# Patient Record
Sex: Female | Born: 1950 | Race: White | Hispanic: No | Marital: Married | Smoking: Never smoker
Health system: Southern US, Community
[De-identification: ages and names within clinical notes are randomized; demographics above are authoritative.]

## PROBLEM LIST (undated history)

## (undated) DIAGNOSIS — J45909 Unspecified asthma, uncomplicated: Secondary | ICD-10-CM

## (undated) DIAGNOSIS — I10 Essential (primary) hypertension: Secondary | ICD-10-CM

## (undated) DIAGNOSIS — D039 Melanoma in situ, unspecified: Secondary | ICD-10-CM

## (undated) DIAGNOSIS — E785 Hyperlipidemia, unspecified: Secondary | ICD-10-CM

## (undated) DIAGNOSIS — M1990 Unspecified osteoarthritis, unspecified site: Secondary | ICD-10-CM

## (undated) DIAGNOSIS — Z87440 Personal history of urinary (tract) infections: Secondary | ICD-10-CM

## (undated) DIAGNOSIS — Z8619 Personal history of other infectious and parasitic diseases: Secondary | ICD-10-CM

## (undated) DIAGNOSIS — U071 COVID-19: Secondary | ICD-10-CM

## (undated) HISTORY — DX: Unspecified osteoarthritis, unspecified site: M19.90

## (undated) HISTORY — PX: BREAST EXCISIONAL BIOPSY: SUR124

## (undated) HISTORY — DX: Personal history of urinary (tract) infections: Z87.440

## (undated) HISTORY — PX: NEPHRECTOMY: SHX65

## (undated) HISTORY — DX: Melanoma in situ, unspecified: D03.9

## (undated) HISTORY — DX: Personal history of other infectious and parasitic diseases: Z86.19

## (undated) HISTORY — DX: Unspecified asthma, uncomplicated: J45.909

## (undated) HISTORY — PX: TONSILLECTOMY AND ADENOIDECTOMY: SUR1326

## (undated) HISTORY — DX: COVID-19: U07.1

## (undated) HISTORY — DX: Hyperlipidemia, unspecified: E78.5

## (undated) HISTORY — DX: Essential (primary) hypertension: I10

---

## 1988-04-11 HISTORY — PX: ABDOMINAL HYSTERECTOMY: SHX81

## 1989-04-11 HISTORY — PX: BREAST SURGERY: SHX581

## 2012-12-03 LAB — HM COLONOSCOPY: HM COLON: NORMAL

## 2012-12-03 LAB — HM PAP SMEAR: HM PAP: NORMAL

## 2014-05-05 LAB — HM MAMMOGRAPHY: HM MAMMO: NORMAL

## 2014-12-04 ENCOUNTER — Encounter (INDEPENDENT_AMBULATORY_CARE_PROVIDER_SITE_OTHER): Payer: Self-pay

## 2014-12-04 ENCOUNTER — Ambulatory Visit: Payer: Self-pay | Admitting: Internal Medicine

## 2014-12-04 ENCOUNTER — Encounter: Payer: Self-pay | Admitting: Internal Medicine

## 2014-12-04 ENCOUNTER — Ambulatory Visit (INDEPENDENT_AMBULATORY_CARE_PROVIDER_SITE_OTHER): Payer: BLUE CROSS/BLUE SHIELD | Admitting: Internal Medicine

## 2014-12-04 VITALS — BP 121/78 | HR 85 | Temp 98.1°F | Ht 62.75 in | Wt 187.0 lb

## 2014-12-04 DIAGNOSIS — I10 Essential (primary) hypertension: Secondary | ICD-10-CM | POA: Diagnosis not present

## 2014-12-04 DIAGNOSIS — Z23 Encounter for immunization: Secondary | ICD-10-CM | POA: Diagnosis not present

## 2014-12-04 DIAGNOSIS — E785 Hyperlipidemia, unspecified: Secondary | ICD-10-CM

## 2014-12-04 DIAGNOSIS — E669 Obesity, unspecified: Secondary | ICD-10-CM

## 2014-12-04 DIAGNOSIS — Z905 Acquired absence of kidney: Secondary | ICD-10-CM

## 2014-12-04 NOTE — Assessment & Plan Note (Signed)
Wt Readings from Last 3 Encounters:  12/04/14 187 lb (84.823 kg)   Body mass index is 33.38 kg/(m^2). Encouraged Mediterranean style diet and exercise.
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
## 2014-12-04 NOTE — Patient Instructions (Signed)
Why follow it? Research shows. . Those who follow the Mediterranean diet have a reduced risk of heart disease  . The diet is associated with a reduced incidence of Parkinson's and Alzheimer's diseases . People following the diet may have longer life expectancies and lower rates of chronic diseases  . The Dietary Guidelines for Americans recommends the Mediterranean diet as an eating plan to promote health and prevent disease  What Is the Mediterranean Diet?  . Healthy eating plan based on typical foods and recipes of Mediterranean-style cooking . The diet is primarily a plant based diet; these foods should make up a majority of meals   Starches - Plant based foods should make up a majority of meals - They are an important sources of vitamins, minerals, energy, antioxidants, and fiber - Choose whole grains, foods high in fiber and minimally processed items  - Typical grain sources include wheat, oats, barley, corn, Arana rice, bulgar, farro, millet, polenta, couscous  - Various types of beans include chickpeas, lentils, fava beans, black beans, white beans   Fruits  Veggies - Large quantities of antioxidant rich fruits & veggies; 6 or more servings  - Vegetables can be eaten raw or lightly drizzled with oil and cooked  - Vegetables common to the traditional Mediterranean Diet include: artichokes, arugula, beets, broccoli, brussel sprouts, cabbage, carrots, celery, collard greens, cucumbers, eggplant, kale, leeks, lemons, lettuce, mushrooms, okra, onions, peas, peppers, potatoes, pumpkin, radishes, rutabaga, shallots, spinach, sweet potatoes, turnips, zucchini - Fruits common to the Mediterranean Diet include: apples, apricots, avocados, cherries, clementines, dates, figs, grapefruits, grapes, melons, nectarines, oranges, peaches, pears, pomegranates, strawberries, tangerines  Fats - Replace butter and margarine with healthy oils, such as olive oil, canola oil, and tahini  - Limit nuts to no  more than a handful a day  - Nuts include walnuts, almonds, pecans, pistachios, pine nuts  - Limit or avoid candied, honey roasted or heavily salted nuts - Olives are central to the Mediterranean diet - can be eaten whole or used in a variety of dishes   Meats Protein - Limiting red meat: no more than a few times a month - When eating red meat: choose lean cuts and keep the portion to the size of deck of cards - Eggs: approx. 0 to 4 times a week  - Fish and lean poultry: at least 2 a week  - Healthy protein sources include, chicken, turkey, lean beef, lamb - Increase intake of seafood such as tuna, salmon, trout, mackerel, shrimp, scallops - Avoid or limit high fat processed meats such as sausage and bacon  Dairy - Include moderate amounts of low fat dairy products  - Focus on healthy dairy such as fat free yogurt, skim milk, low or reduced fat cheese - Limit dairy products higher in fat such as whole or 2% milk, cheese, ice cream  Alcohol - Moderate amounts of red wine is ok  - No more than 5 oz daily for women (all ages) and men older than age 65  - No more than 10 oz of wine daily for men younger than 65  Other - Limit sweets and other desserts  - Use herbs and spices instead of salt to flavor foods  - Herbs and spices common to the traditional Mediterranean Diet include: basil, bay leaves, chives, cloves, cumin, fennel, garlic, lavender, marjoram, mint, oregano, parsley, pepper, rosemary, sage, savory, sumac, tarragon, thyme   It's not just a diet, it's a lifestyle:  . The Mediterranean diet includes   lifestyle factors typical of those in the region  . Foods, drinks and meals are best eaten with others and savored . Daily physical activity is important for overall good health . This could be strenuous exercise like running and aerobics . This could also be more leisurely activities such as walking, housework, yard-work, or taking the stairs . Moderation is the key; a balanced and  healthy diet accommodates most foods and drinks . Consider portion sizes and frequency of consumption of certain foods   Meal Ideas & Options:  . Breakfast:  o Whole wheat toast or whole wheat English muffins with peanut butter & hard boiled egg o Steel cut oats topped with apples & cinnamon and skim milk  o Fresh fruit: banana, strawberries, melon, berries, peaches  o Smoothies: strawberries, bananas, greek yogurt, peanut butter o Low fat greek yogurt with blueberries and granola  o Egg white omelet with spinach and mushrooms o Breakfast couscous: whole wheat couscous, apricots, skim milk, cranberries  . Sandwiches:  o Hummus and grilled vegetables (peppers, zucchini, squash) on whole wheat bread   o Grilled chicken on whole wheat pita with lettuce, tomatoes, cucumbers or tzatziki  o Tuna salad on whole wheat bread: tuna salad made with greek yogurt, olives, red peppers, capers, green onions o Garlic rosemary lamb pita: lamb sauted with garlic, rosemary, salt & pepper; add lettuce, cucumber, greek yogurt to pita - flavor with lemon juice and black pepper  . Seafood:  o Mediterranean grilled salmon, seasoned with garlic, basil, parsley, lemon juice and black pepper o Shrimp, lemon, and spinach whole-grain pasta salad made with low fat greek yogurt  o Seared scallops with lemon orzo  o Seared tuna steaks seasoned salt, pepper, coriander topped with tomato mixture of olives, tomatoes, olive oil, minced garlic, parsley, green onions and cappers  . Meats:  o Herbed greek chicken salad with kalamata olives, cucumber, feta  o Red bell peppers stuffed with spinach, bulgur, lean ground beef (or lentils) & topped with feta   o Kebabs: skewers of chicken, tomatoes, onions, zucchini, squash  o Turkey burgers: made with red onions, mint, dill, lemon juice, feta cheese topped with roasted red peppers . Vegetarian o Cucumber salad: cucumbers, artichoke hearts, celery, red onion, feta cheese, tossed in  olive oil & lemon juice  o Hummus and whole grain pita points with a greek salad (lettuce, tomato, feta, olives, cucumbers, red onion) o Lentil soup with celery, carrots made with vegetable broth, garlic, salt and pepper  o Tabouli salad: parsley, bulgur, mint, scallions, cucumbers, tomato, radishes, lemon juice, olive oil, salt and pepper.      

## 2014-12-04 NOTE — Assessment & Plan Note (Signed)
Discussed current guidelines. Encouraged Mediterranean style diet and exercise with goal of 69min 3x per week. Recommended she continue Atorvastatin. Repeat lipids and LFTs in 6 months.

## 2014-12-04 NOTE — Assessment & Plan Note (Signed)
BP Readings from Last 3 Encounters:  12/04/14 121/78   BP well controlled. Renal function normal on labs from [facility]. Continue Valsartan-HCTZ and Amlodipine.

## 2015-07-29 ENCOUNTER — Encounter: Payer: Self-pay | Admitting: Internal Medicine

## 2015-10-09 ENCOUNTER — Ambulatory Visit (INDEPENDENT_AMBULATORY_CARE_PROVIDER_SITE_OTHER): Payer: BLUE CROSS/BLUE SHIELD | Admitting: Family Medicine

## 2015-10-09 ENCOUNTER — Telehealth: Payer: Self-pay

## 2015-10-09 ENCOUNTER — Encounter: Payer: Self-pay | Admitting: Family Medicine

## 2015-10-09 VITALS — BP 122/80 | HR 76 | Temp 97.7°F | Wt 181.4 lb

## 2015-10-09 DIAGNOSIS — Z760 Encounter for issue of repeat prescription: Secondary | ICD-10-CM

## 2015-10-09 DIAGNOSIS — R21 Rash and other nonspecific skin eruption: Secondary | ICD-10-CM

## 2015-10-09 DIAGNOSIS — H109 Unspecified conjunctivitis: Secondary | ICD-10-CM | POA: Insufficient documentation

## 2015-10-09 MED ORDER — TRIAMCINOLONE ACETONIDE 0.1 % EX OINT: 1.0000 "application " | TOPICAL_OINTMENT | Freq: Two times a day (BID) | CUTANEOUS | Status: DC

## 2015-10-09 MED ORDER — OLOPATADINE HCL 0.2 % OP SOLN: OPHTHALMIC | Status: DC

## 2015-10-09 MED ORDER — VALSARTAN-HYDROCHLOROTHIAZIDE 160-12.5 MG PO TABS: 1.0000 | ORAL_TABLET | Freq: Every day | ORAL | Status: DC

## 2015-10-09 MED ORDER — ESTRADIOL 1.53 MG/SPRAY TD SOLN: 1.0000 | Freq: Every day | TRANSDERMAL | Status: DC

## 2015-10-09 MED ORDER — AMLODIPINE BESYLATE 5 MG PO TABS: 5.0000 mg | ORAL_TABLET | Freq: Every day | ORAL | Status: DC

## 2015-10-09 MED ORDER — POLYMYXIN B-TRIMETHOPRIM 10000-0.1 UNIT/ML-% OP SOLN: 1.0000 [drp] | Freq: Four times a day (QID) | OPHTHALMIC | Status: DC

## 2015-10-09 MED ORDER — ATORVASTATIN CALCIUM 10 MG PO TABS: 10.0000 mg | ORAL_TABLET | Freq: Every day | ORAL | Status: DC

## 2015-10-09 MED ORDER — AMLODIPINE BESYLATE 5 MG PO TABS
5.0000 mg | ORAL_TABLET | Freq: Every day | ORAL | Status: DC
Start: 2015-10-09 — End: 2016-08-04

## 2015-10-09 NOTE — Telephone Encounter (Signed)
Two of the patient's medications were sent [facility]. Please send to [facility].  atorvastatin (LIPITOR) 10 MG tablet  estradiol (EVAMIST) 1.53 MG/SPRAY transdermal spray

## 2015-10-09 NOTE — Progress Notes (Signed)
Subjective:  Patient ID: Danielle Schmidt, female    DOB: 06-Feb-1951  Age: 65 y.o. MRN: [id number]  CC: ? Pink eye, rash  HPI:  65 year old female presents with the above complaints.  Patient reports that she woke up this morning and her eyes were red and crusty. No photophobia or visual disturbance. No pain. + Itching. No known relieving factors. Possibly exacerbated by allergies.  Additionally, patient reports that she's recently developed a rash. Rashes located on the lower extremities predominantly. Mildly itchy. No known exacerbating or relieving factors. No recent/new exposures. No other complaints this time.  Social Hx   Social History   Social History  . Marital Status: Married    Spouse Name: N/A  . Number of Children: N/A  . Years of Education: N/A   Social History Main Topics  . Smoking status: Never Smoker   . Smokeless tobacco: None  . Alcohol Use: 0.0 oz/week    0 Standard drinks or equivalent per week  . Drug Use: No  . Sexual Activity: Not Asked   Other Topics Concern  . None   Social History Narrative   Lives in [place] 6 months and [place] 6 months.   Married   Two children.   Review of Systems  Constitutional: Negative.   Eyes: Positive for discharge, redness and itching. Negative for photophobia, pain and visual disturbance.  Skin: Positive for rash.   Objective:  BP 122/80 mmHg  Pulse 76  Temp(Src) 97.7 F (36.5 C)  Wt 181 lb 6.4 oz (82.283 kg)  SpO2 97%  LMP 12/03/1988 (Approximate)  BP/Weight 10/09/2015 123456  Systolic BP 123XX123 123XX123  Diastolic BP 80 78  Wt. (Lbs) 181.4 187  BMI 32.38 33.38   Physical Exam  Constitutional: She is oriented to person, place, and time. She appears well-developed. No distress.  HENT:  Head: Normocephalic and atraumatic.  Eyes:  Bilateral moderate to severe redness. Watery. No purulent drainage. No photophobia. EOMI.  Neurological: She is alert and oriented to person, place, and time.  Skin:  Papular rash  noted. Scattered over the lower extremities.   Psychiatric: She has a normal mood and affect.  Vitals reviewed.  Assessment & Plan:   Problem List Items Addressed This Visit    Conjunctivitis - Primary    New acute problem. Bacterial versus allergic. Treating empirically with Pataday and Polytrim.      Rash    New problem. Appears contact/allergic. Treating with triamcinolone.        Meds ordered this encounter  Medications  . DISCONTD: Olopatadine HCl 0.2 % SOLN    Sig: 1 drop to each eye daily.    Dispense:  2.5 mL    Refill:  0  . DISCONTD: trimethoprim-polymyxin b (POLYTRIM) ophthalmic solution    Sig: Place 1 drop into both eyes every 6 (six) hours.    Dispense:  10 mL    Refill:  0  . DISCONTD: triamcinolone ointment (KENALOG) 0.1 %    Sig: Apply 1 application topically 2 (two) times daily.    Dispense:  30 g    Refill:  0  . DISCONTD: valsartan-hydrochlorothiazide (DIOVAN-HCT) 160-12.5 MG tablet    Sig: Take 1 tablet by mouth daily.    Dispense:  90 tablet    Refill:  3  . estradiol (EVAMIST) 1.53 MG/SPRAY transdermal spray    Sig: Place 1 spray onto the skin daily.    Dispense:  8.1 mL    Refill:  11  . atorvastatin (LIPITOR)  10 MG tablet    Sig: Take 1 tablet (10 mg total) by mouth daily.    Dispense:  90 tablet    Refill:  3  . DISCONTD: amLODipine (NORVASC) 5 MG tablet    Sig: Take 1 tablet (5 mg total) by mouth daily.    Dispense:  90 tablet    Refill:  3   Follow-up: PRN  [place]

## 2015-10-09 NOTE — Assessment & Plan Note (Signed)
New problem. Appears contact/allergic. Treating with triamcinolone.

## 2015-10-09 NOTE — Patient Instructions (Signed)
Take the medications as prescribed.  Call if you worsen or fail to improve.  Take care  Dr. Lacinda Axon

## 2015-10-09 NOTE — Assessment & Plan Note (Signed)
New acute problem. Bacterial versus allergic. Treating empirically with Pataday and Polytrim.

## 2015-10-09 NOTE — Telephone Encounter (Signed)
[facility]  Sent [facility].

## 2015-10-20 ENCOUNTER — Telehealth: Payer: Self-pay | Admitting: Internal Medicine

## 2015-10-20 NOTE — Telephone Encounter (Signed)
Need to be seen

## 2015-10-20 NOTE — Telephone Encounter (Signed)
Patient was seen on the 30th, please advise, new appt or something else, thanks

## 2015-10-20 NOTE — Telephone Encounter (Signed)
Pt called with a question regarding pt still has a rash. Pt wants to know should she be on allergy medication or should she be seen? Pt is going out of town.  Pharmacy is [id number] - [place], [place] [address]  Call pt @ [phone]. Thank you!

## 2015-10-21 NOTE — Telephone Encounter (Signed)
Patient needs appt. thanks

## 2015-10-21 NOTE — Telephone Encounter (Signed)
Ok. Pt is scheduled for 07/13 @ 3:15pm.

## 2015-10-22 ENCOUNTER — Encounter: Payer: Self-pay | Admitting: Family Medicine

## 2015-10-22 ENCOUNTER — Ambulatory Visit: Payer: BLUE CROSS/BLUE SHIELD | Admitting: Family Medicine

## 2015-10-22 ENCOUNTER — Ambulatory Visit (INDEPENDENT_AMBULATORY_CARE_PROVIDER_SITE_OTHER): Payer: Medicare Other | Admitting: Family Medicine

## 2015-10-22 VITALS — BP 126/72 | HR 86 | Temp 98.0°F | Wt 180.5 lb

## 2015-10-22 DIAGNOSIS — R21 Rash and other nonspecific skin eruption: Secondary | ICD-10-CM

## 2015-10-23 DIAGNOSIS — R21 Rash and other nonspecific skin eruption: Secondary | ICD-10-CM | POA: Insufficient documentation

## 2015-10-23 NOTE — Assessment & Plan Note (Signed)
New rash. Likely allergic in nature. Advised OTC Zyrtec daily.  Follow up as needed.

## 2015-10-23 NOTE — Progress Notes (Signed)
   Subjective:  Patient ID: Danielle Schmidt, female    DOB: 05/15/1950  Age: 65 y.o. MRN: [id number]  CC: Rash  HPI:  65 year old female presents with complaints of rash.  Patient was recently seen on 6/30. She states that the day after she was seen she developed puffiness of the face and subsequently developed a facial rash which has now progressed. Rash is currently located on her arms and trunk. No known inciting factor. No new exposures other than 2 eyedrops that were prescribed on 6/30 by me. No changes in diet. No change in makeup. She has taken some oral steroids improvement. However, the rash persists. Patient is quite concerned about this. No associated fevers or chills. No known exacerbating factors. No other complaints at this time.  Social Hx   Social History   Social History  . Marital Status: Married    Spouse Name: N/A  . Number of Children: N/A  . Years of Education: N/A   Social History Main Topics  . Smoking status: Never Smoker   . Smokeless tobacco: None  . Alcohol Use: 0.0 oz/week    0 Standard drinks or equivalent per week  . Drug Use: No  . Sexual Activity: Not Asked   Other Topics Concern  . None   Social History Narrative   Lives in [place] 6 months and [place] 6 months.   Married   Two children.   Review of Systems  Constitutional: Negative.   Skin: Positive for rash.   Objective:  BP 126/72 mmHg  Pulse 86  Temp(Src) 98 F (36.7 C) (Oral)  Wt 180 lb 8 oz (81.874 kg)  SpO2 95%  LMP 12/03/1988 (Approximate)  BP/Weight 10/22/2015 10/09/2015 123456  Systolic BP 123XX123 123XX123 123XX123  Diastolic BP 72 80 78  Wt. (Lbs) 180.5 181.4 187  BMI 32.22 32.38 33.38   Physical Exam  Constitutional: She is oriented to person, place, and time. She appears well-developed. No distress.  Pulmonary/Chest: Effort normal.  Neurological: She is alert and oriented to person, place, and time.  Skin:  Fine papular rash noted on the trunk and arms.  Psychiatric: She has a  normal mood and affect.  Vitals reviewed.  Assessment & Plan:   Problem List Items Addressed This Visit    Papular rash - Primary    New rash. Likely allergic in nature. Advised OTC Zyrtec daily.  Follow up as needed.         Follow-up: PRN  [place]

## 2015-11-03 ENCOUNTER — Ambulatory Visit: Payer: BLUE CROSS/BLUE SHIELD | Admitting: Internal Medicine

## 2015-12-09 ENCOUNTER — Ambulatory Visit (INDEPENDENT_AMBULATORY_CARE_PROVIDER_SITE_OTHER): Payer: Medicare Other

## 2015-12-09 ENCOUNTER — Telehealth: Payer: Self-pay

## 2015-12-09 DIAGNOSIS — Z23 Encounter for immunization: Secondary | ICD-10-CM | POA: Diagnosis not present

## 2015-12-09 NOTE — Telephone Encounter (Signed)
Patient came in for flu shot, requesting a rx sent to pharmacy for tdap. Please advise and ordered if needed. thanks

## 2015-12-09 NOTE — Progress Notes (Signed)
PAtient came in for flu shot

## 2015-12-10 ENCOUNTER — Other Ambulatory Visit: Payer: Self-pay | Admitting: Family Medicine

## 2015-12-10 MED ORDER — TETANUS-DIPHTH-ACELL PERTUSSIS 5-2.5-18.5 LF-MCG/0.5 IM SUSP: 0.5000 mL | Freq: Once | INTRAMUSCULAR | 0 refills | Status: AC

## 2015-12-10 NOTE — Telephone Encounter (Signed)
Rx sent. Can get at pharmacy.

## 2016-02-18 ENCOUNTER — Telehealth: Payer: Self-pay | Admitting: Family

## 2016-02-18 NOTE — Telephone Encounter (Signed)
Pt was called and stated she wanted EVamist not estradiol

## 2016-02-18 NOTE — Telephone Encounter (Signed)
Called pt and lvm to callback. Lavella Lemons are we able to [place] the information given to start a PA or will form need to be faxed?

## 2016-02-18 NOTE — Telephone Encounter (Signed)
Pt states that her insurance has changed an now needs a PA for estradiol (EVAMIST) 1.53 MG/SPRAY transdermal spray.. Please call [phone]  [phone] Please advise

## 2016-02-18 NOTE — Telephone Encounter (Signed)
Pt returned phone call please call her.

## 2016-02-19 DIAGNOSIS — Z780 Asymptomatic menopausal state: Secondary | ICD-10-CM | POA: Insufficient documentation

## 2016-02-19 NOTE — Telephone Encounter (Signed)
Danielle Schmidt, Please find out what she has tried in the past and what diagnosis PCP wants the med to be under so I can complete the PA. thanks

## 2016-02-19 NOTE — Telephone Encounter (Signed)
Code: Z78.0. This is the code associated with menopause.

## 2016-02-19 NOTE — Telephone Encounter (Signed)
Pt stated that a vaginal femring could be offered if the medication does not go through. Pt wants to know your thoughts on this? Pt was told that PA was sent and it will take up to 224-36 hours.

## 2016-02-19 NOTE — Telephone Encounter (Signed)
I need a ICD 10 code?

## 2016-02-19 NOTE — Telephone Encounter (Signed)
Pt has tried Estradial patches this caused dizziness and headaches. Pt tried premarin and her GYN changed to the evamist due to it being plant based and less reaction.

## 2016-02-19 NOTE — Telephone Encounter (Signed)
Spoke with patient and advised her that the PA would be done today.

## 2016-02-22 NOTE — Telephone Encounter (Signed)
Pt called and told we had received a denial for medication. She was told a appeal was being completed.

## 2016-02-23 NOTE — Telephone Encounter (Signed)
LVTCB in regards to her PA.

## 2016-02-23 NOTE — Telephone Encounter (Signed)
Pt called back and was asked the question listed on the PA appeal for her Evamist RX.

## 2016-02-24 NOTE — Telephone Encounter (Signed)
Appeal was approved, for Evamist.

## 2016-02-25 NOTE — Telephone Encounter (Signed)
Pt informed of the approval. Pt was very appreciative.

## 2016-06-10 ENCOUNTER — Telehealth: Payer: Self-pay | Admitting: Family Medicine

## 2016-06-10 DIAGNOSIS — Z1239 Encounter for other screening for malignant neoplasm of breast: Secondary | ICD-10-CM

## 2016-06-10 NOTE — Telephone Encounter (Signed)
Pt is changing to Danielle Schmidt and needs an order for a Mammogram placed to the breast center in [place] after 16th of April

## 2016-06-10 NOTE — Telephone Encounter (Signed)
Please advise 

## 2016-06-13 NOTE — Telephone Encounter (Signed)
Please Notify pt that done- may use mychart

## 2016-06-14 NOTE — Telephone Encounter (Signed)
Spoke with pt to let her know that the mammogram order had been put in and the pt stated that scheduled her appt yesterday. She also scheduled her CPE with you on 08/04/2016 and scheduled her a fasting lab appt on 08/01/2016.

## 2016-07-26 ENCOUNTER — Ambulatory Visit
Admission: RE | Admit: 2016-07-26 | Discharge: 2016-07-26 | Disposition: A | Discharge status: Home / Self Care | Payer: Medicare Other | Source: Ambulatory Visit | Attending: Family | Admitting: Family

## 2016-07-26 DIAGNOSIS — D229 Melanocytic nevi, unspecified: Secondary | ICD-10-CM | POA: Diagnosis not present

## 2016-07-26 DIAGNOSIS — D225 Melanocytic nevi of trunk: Secondary | ICD-10-CM | POA: Diagnosis not present

## 2016-07-26 DIAGNOSIS — Z1239 Encounter for other screening for malignant neoplasm of breast: Secondary | ICD-10-CM

## 2016-07-26 DIAGNOSIS — L821 Other seborrheic keratosis: Secondary | ICD-10-CM | POA: Diagnosis not present

## 2016-07-26 DIAGNOSIS — Z1231 Encounter for screening mammogram for malignant neoplasm of breast: Secondary | ICD-10-CM | POA: Diagnosis not present

## 2016-07-26 DIAGNOSIS — D492 Neoplasm of unspecified behavior of bone, soft tissue, and skin: Secondary | ICD-10-CM | POA: Diagnosis not present

## 2016-07-26 HISTORY — DX: Melanocytic nevi, unspecified: D22.9

## 2016-07-29 ENCOUNTER — Telehealth: Payer: Self-pay | Admitting: Radiology

## 2016-07-29 ENCOUNTER — Other Ambulatory Visit: Payer: Self-pay | Admitting: Family Medicine

## 2016-07-29 ENCOUNTER — Encounter: Payer: Self-pay | Admitting: Family Medicine

## 2016-07-29 DIAGNOSIS — Z130 Encounter for screening for diseases of the blood and blood-forming organs and certain disorders involving the immune mechanism: Secondary | ICD-10-CM

## 2016-07-29 DIAGNOSIS — R739 Hyperglycemia, unspecified: Secondary | ICD-10-CM

## 2016-07-29 DIAGNOSIS — E785 Hyperlipidemia, unspecified: Secondary | ICD-10-CM

## 2016-07-29 DIAGNOSIS — I10 Essential (primary) hypertension: Secondary | ICD-10-CM

## 2016-07-29 NOTE — Telephone Encounter (Signed)
Pt coming in for labs Monday, please place future orders. Thank you 

## 2016-08-01 ENCOUNTER — Other Ambulatory Visit (INDEPENDENT_AMBULATORY_CARE_PROVIDER_SITE_OTHER): Payer: Medicare Other

## 2016-08-01 ENCOUNTER — Telehealth: Payer: Self-pay | Admitting: Radiology

## 2016-08-01 DIAGNOSIS — E785 Hyperlipidemia, unspecified: Secondary | ICD-10-CM | POA: Diagnosis not present

## 2016-08-01 DIAGNOSIS — Z1159 Encounter for screening for other viral diseases: Secondary | ICD-10-CM | POA: Diagnosis not present

## 2016-08-01 DIAGNOSIS — R739 Hyperglycemia, unspecified: Secondary | ICD-10-CM | POA: Diagnosis not present

## 2016-08-01 DIAGNOSIS — I10 Essential (primary) hypertension: Secondary | ICD-10-CM | POA: Diagnosis not present

## 2016-08-01 LAB — COMPREHENSIVE METABOLIC PANEL
ALBUMIN: 4.4 g/dL (ref 3.5–5.2)
ALT: 17 U/L (ref 0–35)
AST: 17 U/L (ref 0–37)
Alkaline Phosphatase: 66 U/L (ref 39–117)
BUN: 21 mg/dL (ref 6–23)
CALCIUM: 9.6 mg/dL (ref 8.4–10.5)
CHLORIDE: 103 meq/L (ref 96–112)
CO2: 29 meq/L (ref 19–32)
CREATININE: 0.8 mg/dL (ref 0.40–1.20)
GFR: 76.33 mL/min (ref >60.00)
Glucose, Bld: 98 mg/dL (ref 70–99)
Potassium: 4.2 mEq/L (ref 3.5–5.1)
Sodium: 139 mEq/L (ref 135–145)
Total Bilirubin: 0.5 mg/dL (ref 0.2–1.2)
Total Protein: 6.7 g/dL (ref 6.0–8.3)

## 2016-08-01 LAB — LIPID PANEL
CHOL/HDL RATIO: 3
CHOLESTEROL: 222 mg/dL — AB (ref 0–200)
HDL: 68.4 mg/dL (ref >39.00)
LDL CALC: 131 mg/dL — AB (ref 0–99)
NonHDL: 153.32
Triglycerides: 111 mg/dL (ref 0.0–149.0)
VLDL: 22.2 mg/dL (ref 0.0–40.0)

## 2016-08-01 LAB — HEMOGLOBIN A1C: Hgb A1c MFr Bld: 5.7 % (ref 4.6–6.5)

## 2016-08-01 NOTE — Addendum Note (Signed)
Addended by: Arby Barrette on: 08/01/2016 10:19 AM   Modules accepted: Orders

## 2016-08-01 NOTE — Telephone Encounter (Signed)
Pt came in for labs today, and requested to ask if she could have her Hep C drawn? She states she has a physical coming up with you on Thursday. Thank you

## 2016-08-01 NOTE — Telephone Encounter (Signed)
ordered

## 2016-08-02 LAB — HEPATITIS C ANTIBODY: HCV AB: NEGATIVE

## 2016-08-04 ENCOUNTER — Encounter: Payer: Self-pay | Admitting: Family

## 2016-08-04 ENCOUNTER — Ambulatory Visit (INDEPENDENT_AMBULATORY_CARE_PROVIDER_SITE_OTHER): Payer: Medicare Other | Admitting: Family

## 2016-08-04 VITALS — BP 130/78 | HR 78 | Temp 98.2°F | Ht 62.75 in | Wt 181.4 lb

## 2016-08-04 DIAGNOSIS — I10 Essential (primary) hypertension: Secondary | ICD-10-CM

## 2016-08-04 DIAGNOSIS — Z0000 Encounter for general adult medical examination without abnormal findings: Secondary | ICD-10-CM | POA: Insufficient documentation

## 2016-08-04 DIAGNOSIS — Z780 Asymptomatic menopausal state: Secondary | ICD-10-CM

## 2016-08-04 DIAGNOSIS — Z760 Encounter for issue of repeat prescription: Secondary | ICD-10-CM | POA: Diagnosis not present

## 2016-08-04 MED ORDER — VITAMIN D 1000 UNITS PO TABS: 1000.0000 [IU] | ORAL_TABLET | Freq: Every day | ORAL | 3 refills | Status: AC

## 2016-08-04 MED ORDER — AMLODIPINE BESYLATE 5 MG PO TABS: 5.0000 mg | ORAL_TABLET | Freq: Every day | ORAL | 3 refills | Status: DC

## 2016-08-04 MED ORDER — PNEUMOCOCCAL 13-VAL CONJ VACC IM SUSP
0.5000 mL | Freq: Once | INTRAMUSCULAR | Status: AC
  Administered 2016-08-04: 0.5 mL via INTRAMUSCULAR

## 2016-08-04 MED ORDER — ESTRADIOL 1.53 MG/SPRAY TD SOLN: 1.0000 | Freq: Every day | TRANSDERMAL | 11 refills | Status: DC

## 2016-08-04 MED ORDER — ATORVASTATIN CALCIUM 20 MG PO TABS: 20.0000 mg | ORAL_TABLET | Freq: Every day | ORAL | 3 refills | Status: DC

## 2016-08-04 MED ORDER — VALSARTAN-HYDROCHLOROTHIAZIDE 160-12.5 MG PO TABS: 1.0000 | ORAL_TABLET | Freq: Every day | ORAL | 3 refills | Status: DC

## 2016-08-04 MED ORDER — OLOPATADINE HCL 0.2 % OP SOLN: OPHTHALMIC | 0 refills | Status: DC

## 2016-08-04 NOTE — Patient Instructions (Addendum)
Once get results of mammogram, drop a copy off for [place].   Advise Shingrex series ( new shingles vaccine) may start in 2019.   Also need tdap vaccine at anytme  Next year as well, you will need second dose pneumococcal vaccine.    For post menopausal women, guidelines recommend a diet with 1200 mg of Calcium per day. If you are eating calcium rich foods, you do not need a calcium supplement. The body better absorbs the calcium that you eat over supplementation. If you do supplement, I recommend not supplementing the full 1200 mg/ day as this can lead to increased risk of cardiovascular disease. I recommend Calcium Citrate over the counter, and you may take a total of 600 to 800 mg per day in divided doses with meals for best absorption.   For bone health, you need adequate vitamin D, and I recommend you supplement as it is harder to do so with diet alone. I recommend cholecalciferol 800 units daily.  Also, please ensure you are following a diet high in calcium -- research shows better outcomes with dietary sources including kale, yogurt, broccolii, cheese, okra, almonds- to name a few.     Also remember that exercise is a great medicine for maintain and preserve bone health. Advise moderate exercise for 30 minutes , 3 times per week.      Health Maintenance, Female Adopting a healthy lifestyle and getting preventive care can go a long way to promote health and wellness. Talk with your health care provider about what schedule of regular examinations is right for you. This is a good chance for you to check in with your provider about disease prevention and staying healthy. In between checkups, there are plenty of things you can do on your own. Experts have done a lot of research about which lifestyle changes and preventive measures are most likely to keep you healthy. Ask your health care provider for more information. Weight and diet Eat a healthy diet  Be sure to include plenty of  vegetables, fruits, low-fat dairy products, and lean protein.  Do not eat a lot of foods high in solid fats, added sugars, or salt.  Get regular exercise. This is one of the most important things you can do for your health.  Most adults should exercise for at least 150 minutes each week. The exercise should increase your heart rate and make you sweat (moderate-intensity exercise).  Most adults should also do strengthening exercises at least twice a week. This is in addition to the moderate-intensity exercise. Maintain a healthy weight  Body mass index (BMI) is a measurement that can be used to identify possible weight problems. It estimates body fat based on height and weight. Your health care provider can help determine your BMI and help you achieve or maintain a healthy weight.  For females 8 years of age and older:  A BMI below 18.5 is considered underweight.  A BMI of 18.5 to 24.9 is normal.  A BMI of 25 to 29.9 is considered overweight.  A BMI of 30 and above is considered obese. Watch levels of cholesterol and blood lipids  You should start having your blood tested for lipids and cholesterol at 66 years of age, then have this test every 5 years.  You may need to have your cholesterol levels checked more often if:  Your lipid or cholesterol levels are high.  You are older than 66 years of age.  You are at high risk for heart disease.  Cancer screening Lung Cancer  Lung cancer screening is recommended for adults 64-69 years old who are at high risk for lung cancer because of a history of smoking.  A yearly low-dose CT scan of the lungs is recommended for people who:  Currently smoke.  Have quit within the past 15 years.  Have at least a 30-pack-year history of smoking. A pack year is smoking an average of one pack of cigarettes a day for 1 year.  Yearly screening should continue until it has been 15 years since you quit.  Yearly screening should stop if you develop  a health problem that would prevent you from having lung cancer treatment. Breast Cancer  Practice breast self-awareness. This means understanding how your breasts normally appear and feel.  It also means doing regular breast self-exams. Let your health care provider know about any changes, no matter how small.  If you are in your 20s or 30s, you should have a clinical breast exam (CBE) by a health care provider every 1-3 years as part of a regular health exam.  If you are 33 or older, have a CBE every year. Also consider having a breast X-ray (mammogram) every year.  If you have a family history of breast cancer, talk to your health care provider about genetic screening.  If you are at high risk for breast cancer, talk to your health care provider about having an MRI and a mammogram every year.  Breast cancer gene (BRCA) assessment is recommended for women who have family members with BRCA-related cancers. BRCA-related cancers include:  Breast.  Ovarian.  Tubal.  Peritoneal cancers.  Results of the assessment will determine the need for genetic counseling and BRCA1 and BRCA2 testing. Cervical Cancer  Your health care provider may recommend that you be screened regularly for cancer of the pelvic organs (ovaries, uterus, and vagina). This screening involves a pelvic examination, including checking for microscopic changes to the surface of your cervix (Pap test). You may be encouraged to have this screening done every 3 years, beginning at age 61.  For women ages 59-65, health care providers may recommend pelvic exams and Pap testing every 3 years, or they may recommend the Pap and pelvic exam, combined with testing for human papilloma virus (HPV), every 5 years. Some types of HPV increase your risk of cervical cancer. Testing for HPV may also be done on women of any age with unclear Pap test results.  Other health care providers may not recommend any screening for nonpregnant women who  are considered low risk for pelvic cancer and who do not have symptoms. Ask your health care provider if a screening pelvic exam is right for you.  If you have had past treatment for cervical cancer or a condition that could lead to cancer, you need Pap tests and screening for cancer for at least 20 years after your treatment. If Pap tests have been discontinued, your risk factors (such as having a new sexual partner) need to be reassessed to determine if screening should resume. Some women have medical problems that increase the chance of getting cervical cancer. In these cases, your health care provider may recommend more frequent screening and Pap tests. Colorectal Cancer  This type of cancer can be detected and often prevented.  Routine colorectal cancer screening usually begins at 66 years of age and continues through 66 years of age.  Your health care provider may recommend screening at an earlier age if you have risk factors for colon  cancer.  Your health care provider may also recommend using home test kits to check for hidden blood in the stool.  A small camera at the end of a tube can be used to examine your colon directly (sigmoidoscopy or colonoscopy). This is done to check for the earliest forms of colorectal cancer.  Routine screening usually begins at age 43.  Direct examination of the colon should be repeated every 5-10 years through 66 years of age. However, you may need to be screened more often if early forms of precancerous polyps or small growths are found. Skin Cancer  Check your skin from head to toe regularly.  Tell your health care provider about any new moles or changes in moles, especially if there is a change in a mole's shape or color.  Also tell your health care provider if you have a mole that is larger than the size of a pencil eraser.  Always use sunscreen. Apply sunscreen liberally and repeatedly throughout the day.  Protect yourself by wearing long  sleeves, pants, a wide-brimmed hat, and sunglasses whenever you are outside. Heart disease, diabetes, and high blood pressure  High blood pressure causes heart disease and increases the risk of stroke. High blood pressure is more likely to develop in:  People who have blood pressure in the high end of the normal range (130-139/85-89 mm Hg).  People who are overweight or obese.  People who are African American.  If you are 2-54 years of age, have your blood pressure checked every 3-5 years. If you are 42 years of age or older, have your blood pressure checked every year. You should have your blood pressure measured twice-once when you are at a hospital or clinic, and once when you are not at a hospital or clinic. Record the average of the two measurements. To check your blood pressure when you are not at a hospital or clinic, you can use:  An automated blood pressure machine at a pharmacy.  A home blood pressure monitor.  If you are between 75 years and 68 years old, ask your health care provider if you should take aspirin to prevent strokes.  Have regular diabetes screenings. This involves taking a blood sample to check your fasting blood sugar level.  If you are at a normal weight and have a low risk for diabetes, have this test once every three years after 66 years of age.  If you are overweight and have a high risk for diabetes, consider being tested at a younger age or more often. Preventing infection Hepatitis B  If you have a higher risk for hepatitis B, you should be screened for this virus. You are considered at high risk for hepatitis B if:  You were born in a country where hepatitis B is common. Ask your health care provider which countries are considered high risk.  Your parents were born in a high-risk country, and you have not been immunized against hepatitis B (hepatitis B vaccine).  You have HIV or AIDS.  You use needles to inject street drugs.  You live with  someone who has hepatitis B.  You have had sex with someone who has hepatitis B.  You get hemodialysis treatment.  You take certain medicines for conditions, including cancer, organ transplantation, and autoimmune conditions. Hepatitis C  Blood testing is recommended for:  Everyone born from [birth year] through 1965.  Anyone with known risk factors for hepatitis C. Sexually transmitted infections (STIs)  You should be screened for sexually  transmitted infections (STIs) including gonorrhea and chlamydia if:  You are sexually active and are younger than 66 years of age.  You are older than 66 years of age and your health care provider tells you that you are at risk for this type of infection.  Your sexual activity has changed since you were last screened and you are at an increased risk for chlamydia or gonorrhea. Ask your health care provider if you are at risk.  If you do not have HIV, but are at risk, it may be recommended that you take a prescription medicine daily to prevent HIV infection. This is called pre-exposure prophylaxis (PrEP). You are considered at risk if:  You are sexually active and do not regularly use condoms or know the HIV status of your partner(s).  You take drugs by injection.  You are sexually active with a partner who has HIV. Talk with your health care provider about whether you are at high risk of being infected with HIV. If you choose to begin PrEP, you should first be tested for HIV. You should then be tested every 3 months for as long as you are taking PrEP. Pregnancy  If you are premenopausal and you may become pregnant, ask your health care provider about preconception counseling.  If you may become pregnant, take 400 to 800 micrograms (mcg) of folic acid every day.  If you want to prevent pregnancy, talk to your health care provider about birth control (contraception). Osteoporosis and menopause  Osteoporosis is a disease in which the bones lose  minerals and strength with aging. This can result in serious bone fractures. Your risk for osteoporosis can be identified using a bone density scan.  If you are 35 years of age or older, or if you are at risk for osteoporosis and fractures, ask your health care provider if you should be screened.  Ask your health care provider whether you should take a calcium or vitamin D supplement to lower your risk for osteoporosis.  Menopause may have certain physical symptoms and risks.  Hormone replacement therapy may reduce some of these symptoms and risks. Talk to your health care provider about whether hormone replacement therapy is right for you. Follow these instructions at home:  Schedule regular health, dental, and eye exams.  Stay current with your immunizations.  Do not use any tobacco products including cigarettes, chewing tobacco, or electronic cigarettes.  If you are pregnant, do not drink alcohol.  If you are breastfeeding, limit how much and how often you drink alcohol.  Limit alcohol intake to no more than 1 drink per day for nonpregnant women. One drink equals 12 ounces of beer, 5 ounces of wine, or 1 ounces of hard liquor.  Do not use street drugs.  Do not share needles.  Ask your health care provider for help if you need support or information about quitting drugs.  Tell your health care provider if you often feel depressed.  Tell your health care provider if you have ever been abused or do not feel safe at home. This information is not intended to replace advice given to you by your health care provider. Make sure you discuss any questions you have with your health care provider. Document Released: 10/11/2010 Document Revised: 09/03/2015 Document Reviewed: 12/30/2014 Elsevier Interactive Patient Education  2017 [employer].

## 2016-08-04 NOTE — Progress Notes (Signed)
Pre visit review using our clinic review tool, if applicable. No additional management support is needed unless otherwise documented below in the visit note. 

## 2016-08-04 NOTE — Assessment & Plan Note (Signed)
Doing well on evamist. Discussed risks of exogenous estrogen. Advised to try to space out dosing and see how she does. Patient will trial and let me know how she does. Refilled.

## 2016-08-04 NOTE — Assessment & Plan Note (Addendum)
Reviewed labs. Advised increase cholesterol medication based on LDL, h/o HTN. Declines DEXA scan, pap smear. Advised one more pap advised at age 66 yo however h/o hysterectomy so reasonable to stop. Declines CBE as recently had  Mammogram ( she will bring copy of results). UTD colonoscopy per patient. Labs done prior. prevnar given today. Encouraged exercise. Advised tdap, shingrex at local pharmacy.
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
## 2016-08-04 NOTE — Assessment & Plan Note (Signed)
Increased lipitor. Will follow.

## 2016-08-05 ENCOUNTER — Encounter: Payer: Self-pay | Admitting: Family

## 2016-08-05 NOTE — Assessment & Plan Note (Signed)
Stable  Continue current regimen  

## 2016-12-15 ENCOUNTER — Other Ambulatory Visit: Payer: Self-pay | Admitting: Family Medicine

## 2017-01-11 DIAGNOSIS — Z23 Encounter for immunization: Secondary | ICD-10-CM | POA: Diagnosis not present

## 2017-03-17 ENCOUNTER — Encounter: Payer: Self-pay | Admitting: Family Medicine

## 2017-03-17 ENCOUNTER — Ambulatory Visit (INDEPENDENT_AMBULATORY_CARE_PROVIDER_SITE_OTHER): Payer: Medicare Other | Admitting: Family Medicine

## 2017-03-17 VITALS — BP 130/70 | HR 81 | Temp 98.3°F | Wt 184.0 lb

## 2017-03-17 DIAGNOSIS — J069 Acute upper respiratory infection, unspecified: Secondary | ICD-10-CM

## 2017-03-17 MED ORDER — ALBUTEROL SULFATE HFA 108 (90 BASE) MCG/ACT IN AERS: 2.0000 | INHALATION_SPRAY | RESPIRATORY_TRACT | 1 refills | Status: DC | PRN

## 2017-03-17 MED ORDER — AMOXICILLIN 875 MG PO TABS: 875.0000 mg | ORAL_TABLET | Freq: Two times a day (BID) | ORAL | 0 refills | Status: DC

## 2017-03-17 MED ORDER — HYDROCOD POLST-CPM POLST ER 10-8 MG/5ML PO SUER: 5.0000 mL | Freq: Every evening | ORAL | 0 refills | Status: DC | PRN
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
## 2017-03-17 NOTE — Patient Instructions (Signed)
Take your Illene Regulus daily to help with drainage  Take antibiotic if not better in 3-5 days   Drink enough water to make your urine light yellow

## 2017-03-30 ENCOUNTER — Encounter: Payer: Self-pay | Admitting: Family Medicine

## 2017-05-12 ENCOUNTER — Ambulatory Visit: Payer: Medicare Other | Admitting: Family Medicine

## 2017-06-09 ENCOUNTER — Ambulatory Visit: Payer: Medicare Other | Admitting: Primary Care

## 2017-06-09 ENCOUNTER — Ambulatory Visit (INDEPENDENT_AMBULATORY_CARE_PROVIDER_SITE_OTHER): Payer: Medicare Other | Admitting: Family Medicine

## 2017-06-09 ENCOUNTER — Encounter: Payer: Self-pay | Admitting: Family Medicine

## 2017-06-09 VITALS — BP 114/66 | HR 79 | Temp 97.6°F | Wt 187.0 lb

## 2017-06-09 DIAGNOSIS — Z780 Asymptomatic menopausal state: Secondary | ICD-10-CM | POA: Diagnosis not present

## 2017-06-09 DIAGNOSIS — Z7689 Persons encountering health services in other specified circumstances: Secondary | ICD-10-CM

## 2017-06-09 DIAGNOSIS — L608 Other nail disorders: Secondary | ICD-10-CM | POA: Diagnosis not present

## 2017-06-09 DIAGNOSIS — I10 Essential (primary) hypertension: Secondary | ICD-10-CM

## 2017-06-09 DIAGNOSIS — E7800 Pure hypercholesterolemia, unspecified: Secondary | ICD-10-CM | POA: Diagnosis not present

## 2017-06-09 LAB — CBC
HCT: 42.3 % (ref 36.0–46.0)
HEMOGLOBIN: 14 g/dL (ref 12.0–15.0)
MCHC: 33.2 g/dL (ref 30.0–36.0)
MCV: 85.8 fl (ref 78.0–100.0)
Platelets: 217 10*3/uL (ref 150.0–400.0)
RBC: 4.93 Mil/uL (ref 3.87–5.11)
RDW: 13.3 % (ref 11.5–15.5)
WBC: 5.8 10*3/uL (ref 4.0–10.5)

## 2017-06-09 LAB — COMPREHENSIVE METABOLIC PANEL
ALK PHOS: 83 U/L (ref 39–117)
ALT: 18 U/L (ref 0–35)
AST: 17 U/L (ref 0–37)
Albumin: 4 g/dL (ref 3.5–5.2)
BILIRUBIN TOTAL: 0.4 mg/dL (ref 0.2–1.2)
BUN: 15 mg/dL (ref 6–23)
CALCIUM: 9.7 mg/dL (ref 8.4–10.5)
CO2: 31 mEq/L (ref 19–32)
Chloride: 104 mEq/L (ref 96–112)
Creatinine, Ser: 0.78 mg/dL (ref 0.40–1.20)
GFR: 78.39 mL/min (ref >60.00)
GLUCOSE: 93 mg/dL (ref 70–99)
Potassium: 4 mEq/L (ref 3.5–5.1)
Sodium: 140 mEq/L (ref 135–145)
TOTAL PROTEIN: 7 g/dL (ref 6.0–8.3)

## 2017-06-09 LAB — LIPID PANEL
CHOL/HDL RATIO: 3
Cholesterol: 177 mg/dL (ref 0–200)
HDL: 56.4 mg/dL (ref >39.00)
LDL Cholesterol: 101 mg/dL — ABNORMAL HIGH (ref 0–99)
NONHDL: 120.27
TRIGLYCERIDES: 98 mg/dL (ref 0.0–149.0)
VLDL: 19.6 mg/dL (ref 0.0–40.0)

## 2017-06-09 NOTE — Patient Instructions (Signed)
Good to see you today  Please stop at the lab
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
## 2017-06-26 ENCOUNTER — Other Ambulatory Visit: Payer: Self-pay | Admitting: Family

## 2017-06-26 ENCOUNTER — Other Ambulatory Visit: Payer: Self-pay | Admitting: Family Medicine

## 2017-06-26 DIAGNOSIS — Z1231 Encounter for screening mammogram for malignant neoplasm of breast: Secondary | ICD-10-CM

## 2017-07-31 ENCOUNTER — Ambulatory Visit
Admission: RE | Admit: 2017-07-31 | Discharge: 2017-07-31 | Disposition: A | Discharge status: Home / Self Care | Payer: Medicare Other | Source: Ambulatory Visit | Attending: Family Medicine | Admitting: Family Medicine

## 2017-07-31 DIAGNOSIS — Z1231 Encounter for screening mammogram for malignant neoplasm of breast: Secondary | ICD-10-CM

## 2017-08-16 ENCOUNTER — Other Ambulatory Visit: Payer: Self-pay | Admitting: Family

## 2017-08-16 DIAGNOSIS — Z760 Encounter for issue of repeat prescription: Secondary | ICD-10-CM

## 2017-08-16 DIAGNOSIS — Z0000 Encounter for general adult medical examination without abnormal findings: Secondary | ICD-10-CM

## 2017-08-16 DIAGNOSIS — E7800 Pure hypercholesterolemia, unspecified: Secondary | ICD-10-CM

## 2017-08-16 DIAGNOSIS — I10 Essential (primary) hypertension: Secondary | ICD-10-CM

## 2017-08-21 DIAGNOSIS — D229 Melanocytic nevi, unspecified: Secondary | ICD-10-CM | POA: Diagnosis not present

## 2017-08-21 DIAGNOSIS — C4492 Squamous cell carcinoma of skin, unspecified: Secondary | ICD-10-CM

## 2017-08-21 DIAGNOSIS — L564 Polymorphous light eruption: Secondary | ICD-10-CM | POA: Diagnosis not present

## 2017-08-21 DIAGNOSIS — C44329 Squamous cell carcinoma of skin of other parts of face: Secondary | ICD-10-CM | POA: Diagnosis not present

## 2017-08-21 HISTORY — DX: Squamous cell carcinoma of skin, unspecified: C44.92

## 2017-08-23 ENCOUNTER — Ambulatory Visit: Payer: Medicare Other

## 2017-08-23 ENCOUNTER — Ambulatory Visit (INDEPENDENT_AMBULATORY_CARE_PROVIDER_SITE_OTHER): Payer: Medicare Other | Admitting: Family Medicine

## 2017-08-23 ENCOUNTER — Other Ambulatory Visit: Payer: Self-pay | Admitting: Family Medicine

## 2017-08-23 ENCOUNTER — Ambulatory Visit: Payer: Medicare Other | Admitting: Primary Care

## 2017-08-23 ENCOUNTER — Ambulatory Visit (INDEPENDENT_AMBULATORY_CARE_PROVIDER_SITE_OTHER): Payer: Medicare Other

## 2017-08-23 ENCOUNTER — Encounter: Payer: Self-pay | Admitting: Family Medicine

## 2017-08-23 VITALS — BP 134/78 | HR 62 | Temp 97.9°F | Ht 62.75 in | Wt 182.2 lb

## 2017-08-23 DIAGNOSIS — I10 Essential (primary) hypertension: Secondary | ICD-10-CM | POA: Diagnosis not present

## 2017-08-23 DIAGNOSIS — E2839 Other primary ovarian failure: Secondary | ICD-10-CM

## 2017-08-23 DIAGNOSIS — E7800 Pure hypercholesterolemia, unspecified: Secondary | ICD-10-CM

## 2017-08-23 DIAGNOSIS — Z0000 Encounter for general adult medical examination without abnormal findings: Secondary | ICD-10-CM | POA: Diagnosis not present

## 2017-08-23 DIAGNOSIS — B009 Herpesviral infection, unspecified: Secondary | ICD-10-CM

## 2017-08-23 DIAGNOSIS — L608 Other nail disorders: Secondary | ICD-10-CM

## 2017-08-23 DIAGNOSIS — J4520 Mild intermittent asthma, uncomplicated: Secondary | ICD-10-CM

## 2017-08-23 DIAGNOSIS — R002 Palpitations: Secondary | ICD-10-CM | POA: Diagnosis not present

## 2017-08-23 DIAGNOSIS — E559 Vitamin D deficiency, unspecified: Secondary | ICD-10-CM

## 2017-08-23 MED ORDER — AMLODIPINE BESYLATE 5 MG PO TABS: 5.0000 mg | ORAL_TABLET | Freq: Every day | ORAL | 3 refills | Status: DC

## 2017-08-23 MED ORDER — ESTRADIOL 1.53 MG/SPRAY TD SOLN: 1.0000 | Freq: Every day | TRANSDERMAL | 11 refills | Status: DC

## 2017-08-23 MED ORDER — ATORVASTATIN CALCIUM 20 MG PO TABS: 20.0000 mg | ORAL_TABLET | Freq: Every day | ORAL | 3 refills | Status: DC

## 2017-08-23 MED ORDER — ACYCLOVIR 400 MG PO TABS: 400.0000 mg | ORAL_TABLET | ORAL | 1 refills | Status: DC | PRN

## 2017-08-23 MED ORDER — VALSARTAN-HYDROCHLOROTHIAZIDE 160-12.5 MG PO TABS: 1.0000 | ORAL_TABLET | Freq: Every day | ORAL | 3 refills | Status: DC

## 2017-08-23 MED ORDER — ALBUTEROL SULFATE HFA 108 (90 BASE) MCG/ACT IN AERS: 2.0000 | INHALATION_SPRAY | RESPIRATORY_TRACT | 1 refills | Status: DC | PRN

## 2017-08-23 NOTE — Patient Instructions (Addendum)
Ms. Naples , Thank you for taking time to come for your Medicare Wellness Visit. I appreciate your ongoing commitment to your health goals. Please review the following plan we discussed and let me know if I can assist you in the future.   These are the goals we discussed: Goals    . Increase physical activity     Starting 08/23/2017, I will attempt to exercise for at least 60 minutes 2-3 days per week.        This is a list of the screening recommended for you and due dates:  Health Maintenance  Topic Date Due  . DEXA scan (bone density measurement)  08/24/2018*  . Tetanus Vaccine  08/24/2018*  . Pneumonia vaccines (2 of 2 - PPSV23) 08/24/2018*  . Flu Shot  11/09/2017  . Mammogram  08/01/2019  . Colon Cancer Screening  12/04/2022  .  Hepatitis C: One time screening is recommended by Center for Disease Control  (CDC) for  adults born from [birth year] through 1965.   Completed  *Topic was postponed. The date shown is not the original due date.   Preventive Care for Adults  A healthy lifestyle and preventive care can promote health and wellness. Preventive health guidelines for adults include the following key practices.  . A routine yearly physical is a good way to check with your health care provider about your health and preventive screening. It is a chance to share any concerns and updates on your health and to receive a thorough exam.  . Visit your dentist for a routine exam and preventive care every 6 months. Brush your teeth twice a day and floss once a day. Good oral hygiene prevents tooth decay and gum disease.  . The frequency of eye exams is based on your age, health, family medical history, use  of contact lenses, and other factors. Follow your health care provider's recommendations for frequency of eye exams.  . Eat a healthy diet. Foods like vegetables, fruits, whole grains, low-fat dairy products, and lean protein foods contain the nutrients you need without too many calories.  Decrease your intake of foods high in solid fats, added sugars, and salt. Eat the right amount of calories for you. Get information about a proper diet from your health care provider, if necessary.  . Regular physical exercise is one of the most important things you can do for your health. Most adults should get at least 150 minutes of moderate-intensity exercise (any activity that increases your heart rate and causes you to sweat) each week. In addition, most adults need muscle-strengthening exercises on 2 or more days a week.  Silver Sneakers may be a benefit available to you. To determine eligibility, you may visit the website: www.silversneakers.com or contact program at 9122501949 Mon-Fri between 8AM-8PM.   . Maintain a healthy weight. The body mass index (BMI) is a screening tool to identify possible weight problems. It provides an estimate of body fat based on height and weight. Your health care provider can find your BMI and can help you achieve or maintain a healthy weight.   For adults 20 years and older: ? A BMI below 18.5 is considered underweight. ? A BMI of 18.5 to 24.9 is normal. ? A BMI of 25 to 29.9 is considered overweight. ? A BMI of 30 and above is considered obese.   . Maintain normal blood lipids and cholesterol levels by exercising and minimizing your intake of saturated fat. Eat a balanced diet with plenty of fruit  and vegetables. Blood tests for lipids and cholesterol should begin at age 43 and be repeated every 5 years. If your lipid or cholesterol levels are high, you are over 50, or you are at high risk for heart disease, you may need your cholesterol levels checked more frequently. Ongoing high lipid and cholesterol levels should be treated with medicines if diet and exercise are not working.  . If you smoke, find out from your health care provider how to quit. If you do not use tobacco, please do not start.  . If you choose to drink alcohol, please do not consume  more than 2 drinks per day. One drink is considered to be 12 ounces (355 mL) of beer, 5 ounces (148 mL) of wine, or 1.5 ounces (44 mL) of liquor.  . If you are 62-73 years old, ask your health care provider if you should take aspirin to prevent strokes.  . Use sunscreen. Apply sunscreen liberally and repeatedly throughout the day. You should seek shade when your shadow is shorter than you. Protect yourself by wearing long sleeves, pants, a wide-brimmed hat, and sunglasses year round, whenever you are outdoors.  . Once a month, do a whole body skin exam, using a mirror to look at the skin on your back. Tell your health care provider of new moles, moles that have irregular borders, moles that are larger than a pencil eraser, or moles that have changed in shape or color.
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
## 2017-08-23 NOTE — Patient Instructions (Signed)
Good to see you today  Please follow up in 6 months, sooner if needed  Keeping You Healthy  Get These Tests  Blood Pressure- Have your blood pressure checked by your healthcare provider at least once a year.  Normal blood pressure is 120/80.  Weight- Have your body mass index (BMI) calculated to screen for obesity.  BMI is a measure of body fat based on height and weight.  You can calculate your own BMI at [employer].it  Cholesterol- Have your cholesterol checked every year.  Diabetes- Have your blood sugar checked every year if you have high blood pressure, high cholesterol, a family history of diabetes or if you are overweight.  Pap Test - Have a pap test every 1 to 5 years if you have been sexually active.  If you are older than 65 and recent pap tests have been normal you may not need additional pap tests.  In addition, if you have had a hysterectomy  for benign disease additional pap tests are not necessary.  Mammogram-Yearly mammograms are essential for early detection of breast cancer  Screening for Colon Cancer- Colonoscopy starting at age 43. Screening may begin sooner depending on your family history and other health conditions.  Follow up colonoscopy as directed by your Gastroenterologist.  Screening for Osteoporosis- Screening begins at age 68 with bone density scanning, sooner if you are at higher risk for developing Osteoporosis.  Get these medicines  Calcium with Vitamin D- Your body requires 1200-1500 mg of Calcium a day and [phone] IU of Vitamin D a day.  You can only absorb 500 mg of Calcium at a time therefore Calcium must be taken in 2 or 3 separate doses throughout the day.  Hormones- Hormone therapy has been associated with increased risk for certain cancers and heart disease.  Talk to your healthcare provider about if you need relief from menopausal symptoms.  Aspirin- Ask your healthcare provider about taking Aspirin to prevent Heart Disease and  Stroke.  Get these Immuniztions  Flu shot- Every fall  Pneumonia shot- Once after the age of 63; if you are younger ask your healthcare provider if you need a pneumonia shot.  Tetanus- Every ten years.  Zostavax- Once after the age of 63 to prevent shingles.  Take these steps  Don't smoke- Your healthcare provider can help you quit. For tips on how to quit, ask your healthcare provider or go to www.smokefree.gov or call 1-800 QUIT-NOW.  Be physically active- Exercise 5 days a week for a minimum of 30 minutes.  If you are not already physically active, start slow and gradually work up to 30 minutes of moderate physical activity.  Try walking, dancing, bike riding, swimming, etc.  Eat a healthy diet- Eat a variety of healthy foods such as fruits, vegetables, whole grains, low fat milk, low fat cheeses, yogurt, lean meats, chicken, fish, eggs, dried beans, tofu, etc.  For more information go to www.thenutritionsource.org  Dental visit- Brush and floss teeth twice daily; visit your dentist twice a year.  Eye exam- Visit your Optometrist or Ophthalmologist yearly.  Drink alcohol in moderation- Limit alcohol intake to one drink or less a day.  Never drink and drive.  Depression- Your emotional health is as important as your physical health.  If you're feeling down or losing interest in things you normally enjoy, please talk to your healthcare provider.  Seat Belts- can save your life; always wear one  Smoke/Carbon Monoxide detectors- These detectors need to be installed on the  appropriate level of your home.  Replace batteries at least once a year.  Violence- If anyone is threatening or hurting you, please tell your healthcare provider.  Living Will/ Health care power of attorney- Discuss with your healthcare provider and family.

## 2017-08-23 NOTE — Progress Notes (Signed)
PCP notes:   Health maintenance:  Bone density - PCP please address PPSV23 - pt will review health records Tetanus vaccine - postponed/insurance  Abnormal screenings:    None  Patient concerns:   None  Nurse concerns:  None  Next PCP appt:   08/13/17 @ 1030
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
## 2017-08-24 NOTE — Progress Notes (Addendum)
I reviewed health advisor's note, was available for consultation, and agree with documentation and plan.  

## 2017-08-29 ENCOUNTER — Encounter: Payer: Self-pay | Admitting: Family Medicine

## 2017-09-08 NOTE — Progress Notes (Signed)
I reviewed health advisor's note, was available for consultation, and agree with documentation and plan.  

## 2017-09-22 LAB — CULTURE, FUNGUS WITHOUT SMEAR
MICRO NUMBER: 90592214
SPECIMEN QUALITY: ADEQUATE

## 2017-10-02 ENCOUNTER — Encounter: Payer: Self-pay | Admitting: Family Medicine

## 2017-10-02 DIAGNOSIS — D485 Neoplasm of uncertain behavior of skin: Secondary | ICD-10-CM | POA: Diagnosis not present

## 2017-10-02 DIAGNOSIS — C44329 Squamous cell carcinoma of skin of other parts of face: Secondary | ICD-10-CM | POA: Diagnosis not present

## 2017-10-02 DIAGNOSIS — L905 Scar conditions and fibrosis of skin: Secondary | ICD-10-CM | POA: Diagnosis not present

## 2017-10-04 DIAGNOSIS — C44329 Squamous cell carcinoma of skin of other parts of face: Secondary | ICD-10-CM | POA: Diagnosis not present

## 2017-10-04 DIAGNOSIS — L905 Scar conditions and fibrosis of skin: Secondary | ICD-10-CM | POA: Diagnosis not present

## 2017-10-24 ENCOUNTER — Encounter: Payer: Self-pay | Admitting: Family Medicine

## 2017-10-25 ENCOUNTER — Other Ambulatory Visit: Payer: Self-pay | Admitting: Family Medicine

## 2017-11-06 DIAGNOSIS — L570 Actinic keratosis: Secondary | ICD-10-CM | POA: Diagnosis not present

## 2017-11-10 ENCOUNTER — Other Ambulatory Visit: Payer: Self-pay | Admitting: *Deleted

## 2017-11-10 DIAGNOSIS — E7800 Pure hypercholesterolemia, unspecified: Secondary | ICD-10-CM

## 2017-11-10 DIAGNOSIS — I10 Essential (primary) hypertension: Secondary | ICD-10-CM

## 2017-11-10 MED ORDER — VALSARTAN-HYDROCHLOROTHIAZIDE 160-12.5 MG PO TABS: 1.0000 | ORAL_TABLET | Freq: Every day | ORAL | 1 refills | Status: DC

## 2017-11-10 MED ORDER — ATORVASTATIN CALCIUM 20 MG PO TABS
20.0000 mg | ORAL_TABLET | Freq: Every day | ORAL | 1 refills | Status: DC
Start: 2017-11-10 — End: 2018-02-28

## 2017-12-15 ENCOUNTER — Encounter: Payer: Self-pay | Admitting: Family Medicine

## 2017-12-17 ENCOUNTER — Encounter: Payer: Self-pay | Admitting: Family Medicine

## 2017-12-17 DIAGNOSIS — Z23 Encounter for immunization: Secondary | ICD-10-CM | POA: Diagnosis not present

## 2017-12-20 ENCOUNTER — Encounter: Payer: Self-pay | Admitting: Family Medicine

## 2018-02-28 ENCOUNTER — Encounter: Payer: Self-pay | Admitting: Family Medicine

## 2018-02-28 ENCOUNTER — Ambulatory Visit (INDEPENDENT_AMBULATORY_CARE_PROVIDER_SITE_OTHER): Payer: Medicare Other | Admitting: Family Medicine

## 2018-02-28 VITALS — BP 130/60 | HR 69 | Temp 97.9°F | Ht 62.75 in | Wt 189.8 lb

## 2018-02-28 DIAGNOSIS — I10 Essential (primary) hypertension: Secondary | ICD-10-CM | POA: Diagnosis not present

## 2018-02-28 DIAGNOSIS — Z23 Encounter for immunization: Secondary | ICD-10-CM | POA: Diagnosis not present

## 2018-02-28 DIAGNOSIS — E785 Hyperlipidemia, unspecified: Secondary | ICD-10-CM | POA: Diagnosis not present

## 2018-02-28 MED ORDER — ATORVASTATIN CALCIUM 10 MG PO TABS: 10.0000 mg | ORAL_TABLET | Freq: Every day | ORAL | 3 refills | Status: DC
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
## 2018-02-28 NOTE — Patient Instructions (Signed)
Great to see you today! Please follow up in 6 months.  

## 2018-03-02 ENCOUNTER — Encounter: Payer: Self-pay | Admitting: Family Medicine

## 2018-03-19 DIAGNOSIS — L709 Acne, unspecified: Secondary | ICD-10-CM | POA: Diagnosis not present

## 2018-03-19 DIAGNOSIS — C44319 Basal cell carcinoma of skin of other parts of face: Secondary | ICD-10-CM | POA: Diagnosis not present

## 2018-03-19 DIAGNOSIS — C4491 Basal cell carcinoma of skin, unspecified: Secondary | ICD-10-CM

## 2018-03-19 HISTORY — DX: Basal cell carcinoma of skin, unspecified: C44.91

## 2018-05-19 ENCOUNTER — Encounter: Payer: Self-pay | Admitting: Family Medicine

## 2018-05-19 ENCOUNTER — Other Ambulatory Visit: Payer: Self-pay | Admitting: Family Medicine

## 2018-05-19 DIAGNOSIS — E2839 Other primary ovarian failure: Secondary | ICD-10-CM

## 2018-05-19 MED ORDER — ESTRADIOL 1.53 MG/SPRAY TD SOLN: 1.0000 | Freq: Every day | TRANSDERMAL | 0 refills | Status: DC

## 2018-05-19 NOTE — Addendum Note (Signed)
Addended by: Lurlean Nanny on: 05/19/2018 01:01 PM   Modules accepted: Orders

## 2018-06-26 DIAGNOSIS — C44319 Basal cell carcinoma of skin of other parts of face: Secondary | ICD-10-CM | POA: Diagnosis not present

## 2018-08-20 ENCOUNTER — Encounter: Payer: Self-pay | Admitting: Family Medicine

## 2018-08-23 ENCOUNTER — Encounter: Payer: Self-pay | Admitting: Family Medicine

## 2018-08-27 ENCOUNTER — Other Ambulatory Visit: Payer: Self-pay | Admitting: Family Medicine

## 2018-08-27 DIAGNOSIS — Z905 Acquired absence of kidney: Secondary | ICD-10-CM

## 2018-08-27 DIAGNOSIS — I10 Essential (primary) hypertension: Secondary | ICD-10-CM

## 2018-08-27 DIAGNOSIS — E785 Hyperlipidemia, unspecified: Secondary | ICD-10-CM

## 2018-08-27 DIAGNOSIS — E669 Obesity, unspecified: Secondary | ICD-10-CM

## 2018-08-27 DIAGNOSIS — R739 Hyperglycemia, unspecified: Secondary | ICD-10-CM

## 2018-08-29 ENCOUNTER — Ambulatory Visit (INDEPENDENT_AMBULATORY_CARE_PROVIDER_SITE_OTHER): Payer: Medicare Other

## 2018-08-29 ENCOUNTER — Other Ambulatory Visit (INDEPENDENT_AMBULATORY_CARE_PROVIDER_SITE_OTHER): Payer: Medicare Other

## 2018-08-29 DIAGNOSIS — I10 Essential (primary) hypertension: Secondary | ICD-10-CM | POA: Diagnosis not present

## 2018-08-29 DIAGNOSIS — E785 Hyperlipidemia, unspecified: Secondary | ICD-10-CM | POA: Diagnosis not present

## 2018-08-29 DIAGNOSIS — E669 Obesity, unspecified: Secondary | ICD-10-CM

## 2018-08-29 DIAGNOSIS — Z905 Acquired absence of kidney: Secondary | ICD-10-CM | POA: Diagnosis not present

## 2018-08-29 DIAGNOSIS — R739 Hyperglycemia, unspecified: Secondary | ICD-10-CM

## 2018-08-29 DIAGNOSIS — Z0000 Encounter for general adult medical examination without abnormal findings: Secondary | ICD-10-CM

## 2018-08-29 LAB — COMPREHENSIVE METABOLIC PANEL
ALT: 22 U/L (ref 0–35)
AST: 18 U/L (ref 0–37)
Albumin: 4.1 g/dL (ref 3.5–5.2)
Alkaline Phosphatase: 74 U/L (ref 39–117)
BUN: 19 mg/dL (ref 6–23)
CO2: 29 mEq/L (ref 19–32)
Calcium: 9 mg/dL (ref 8.4–10.5)
Chloride: 101 mEq/L (ref 96–112)
Creatinine, Ser: 0.88 mg/dL (ref 0.40–1.20)
GFR: 63.93 mL/min (ref >60.00)
Glucose, Bld: 103 mg/dL — ABNORMAL HIGH (ref 70–99)
Potassium: 3.8 mEq/L (ref 3.5–5.1)
Sodium: 137 mEq/L (ref 135–145)
Total Bilirubin: 0.6 mg/dL (ref 0.2–1.2)
Total Protein: 6.6 g/dL (ref 6.0–8.3)

## 2018-08-29 LAB — LIPID PANEL
Cholesterol: 209 mg/dL — ABNORMAL HIGH (ref 0–200)
HDL: 71.6 mg/dL (ref >39.00)
LDL Cholesterol: 112 mg/dL — ABNORMAL HIGH (ref 0–99)
NonHDL: 137.48
Total CHOL/HDL Ratio: 3
Triglycerides: 129 mg/dL (ref 0.0–149.0)
VLDL: 25.8 mg/dL (ref 0.0–40.0)

## 2018-08-29 LAB — CBC WITH DIFFERENTIAL/PLATELET
Basophils Absolute: 0 10*3/uL (ref 0.0–0.1)
Basophils Relative: 0.7 % (ref 0.0–3.0)
Eosinophils Absolute: 0.1 10*3/uL (ref 0.0–0.7)
Eosinophils Relative: 1.4 % (ref 0.0–5.0)
HCT: 40.8 % (ref 36.0–46.0)
Hemoglobin: 13.7 g/dL (ref 12.0–15.0)
Lymphocytes Relative: 40.5 % (ref 12.0–46.0)
Lymphs Abs: 2.4 10*3/uL (ref 0.7–4.0)
MCHC: 33.5 g/dL (ref 30.0–36.0)
MCV: 85.7 fl (ref 78.0–100.0)
Monocytes Absolute: 0.5 10*3/uL (ref 0.1–1.0)
Monocytes Relative: 7.9 % (ref 3.0–12.0)
Neutro Abs: 3 10*3/uL (ref 1.4–7.7)
Neutrophils Relative %: 49.5 % (ref 43.0–77.0)
Platelets: 209 10*3/uL (ref 150.0–400.0)
RBC: 4.77 Mil/uL (ref 3.87–5.11)
RDW: 13.8 % (ref 11.5–15.5)
WBC: 6 10*3/uL (ref 4.0–10.5)

## 2018-08-29 LAB — HEMOGLOBIN A1C: Hgb A1c MFr Bld: 5.5 % (ref 4.6–6.5)

## 2018-08-29 LAB — TSH: TSH: 2.42 u[IU]/mL (ref 0.35–4.50)

## 2018-08-29 NOTE — Progress Notes (Signed)
PCP notes:   Health maintenance:  Bone density - postponed Tetanus vaccine - postponed/insurance  Abnormal screenings:   None  Patient concerns:   None  Nurse concerns:  None  Next PCP appt:   09/05/18 @ 1030

## 2018-08-29 NOTE — Patient Instructions (Signed)
Danielle Schmidt , Thank you for taking time to come for your Medicare Wellness Visit. I appreciate your ongoing commitment to your health goals. Please review the following plan we discussed and let me know if I can assist you in the future.   These are the goals we discussed: Goals    . Patient Stated     Starting 08/29/18, I will continue to take medications as prescribed.        This is a list of the screening recommended for you and due dates:  Health Maintenance  Topic Date Due  . Tetanus Vaccine  10/24/1969  . DEXA scan (bone density measurement)  10/25/2015  . Flu Shot  11/10/2018  . Mammogram  08/01/2019  . Colon Cancer Screening  12/04/2022  .  Hepatitis C: One time screening is recommended by Center for Disease Control  (CDC) for  adults born from [birth year] through 1965.   Completed  . Pneumonia vaccines  Completed   Preventive Care for Adults  A healthy lifestyle and preventive care can promote health and wellness. Preventive health guidelines for adults include the following key practices.  . A routine yearly physical is a good way to check with your health care provider about your health and preventive screening. It is a chance to share any concerns and updates on your health and to receive a thorough exam.  . Visit your dentist for a routine exam and preventive care every 6 months. Brush your teeth twice a day and floss once a day. Good oral hygiene prevents tooth decay and gum disease.  . The frequency of eye exams is based on your age, health, family medical history, use  of contact lenses, and other factors. Follow your health care provider's recommendations for frequency of eye exams.  . Eat a healthy diet. Foods like vegetables, fruits, whole grains, low-fat dairy products, and lean protein foods contain the nutrients you need without too many calories. Decrease your intake of foods high in solid fats, added sugars, and salt. Eat the right amount of calories for you. Get  information about a proper diet from your health care provider, if necessary.  . Regular physical exercise is one of the most important things you can do for your health. Most adults should get at least 150 minutes of moderate-intensity exercise (any activity that increases your heart rate and causes you to sweat) each week. In addition, most adults need muscle-strengthening exercises on 2 or more days a week.  Silver Sneakers may be a benefit available to you. To determine eligibility, you may visit the website: www.silversneakers.com or contact program at (614) 210-8461 Mon-Fri between 8AM-8PM.   . Maintain a healthy weight. The body mass index (BMI) is a screening tool to identify possible weight problems. It provides an estimate of body fat based on height and weight. Your health care provider can find your BMI and can help you achieve or maintain a healthy weight.   For adults 20 years and older: ? A BMI below 18.5 is considered underweight. ? A BMI of 18.5 to 24.9 is normal. ? A BMI of 25 to 29.9 is considered overweight. ? A BMI of 30 and above is considered obese.   . Maintain normal blood lipids and cholesterol levels by exercising and minimizing your intake of saturated fat. Eat a balanced diet with plenty of fruit and vegetables. Blood tests for lipids and cholesterol should begin at age 7 and be repeated every 5 years. If your lipid or cholesterol  levels are high, you are over 50, or you are at high risk for heart disease, you may need your cholesterol levels checked more frequently. Ongoing high lipid and cholesterol levels should be treated with medicines if diet and exercise are not working.  . If you smoke, find out from your health care provider how to quit. If you do not use tobacco, please do not start.  . If you choose to drink alcohol, please do not consume more than 2 drinks per day. One drink is considered to be 12 ounces (355 mL) of beer, 5 ounces (148 mL) of wine, or 1.5  ounces (44 mL) of liquor.  . If you are 62-50 years old, ask your health care provider if you should take aspirin to prevent strokes.  . Use sunscreen. Apply sunscreen liberally and repeatedly throughout the day. You should seek shade when your shadow is shorter than you. Protect yourself by wearing long sleeves, pants, a wide-brimmed hat, and sunglasses year round, whenever you are outdoors.  . Once a month, do a whole body skin exam, using a mirror to look at the skin on your back. Tell your health care provider of new moles, moles that have irregular borders, moles that are larger than a pencil eraser, or moles that have changed in shape or color.
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
## 2018-08-31 NOTE — Progress Notes (Signed)
I reviewed health advisor's note, was available for consultation, and agree with documentation and plan.  

## 2018-09-04 ENCOUNTER — Other Ambulatory Visit: Payer: Self-pay | Admitting: Family Medicine

## 2018-09-04 DIAGNOSIS — Z1231 Encounter for screening mammogram for malignant neoplasm of breast: Secondary | ICD-10-CM

## 2018-09-05 ENCOUNTER — Encounter: Payer: Medicare Other | Admitting: Family Medicine

## 2018-09-06 ENCOUNTER — Other Ambulatory Visit: Payer: Self-pay

## 2018-09-06 ENCOUNTER — Ambulatory Visit
Admission: RE | Admit: 2018-09-06 | Discharge: 2018-09-06 | Disposition: A | Discharge status: Home / Self Care | Payer: Medicare Other | Source: Ambulatory Visit | Attending: Family Medicine | Admitting: Family Medicine

## 2018-09-06 DIAGNOSIS — Z1231 Encounter for screening mammogram for malignant neoplasm of breast: Secondary | ICD-10-CM

## 2018-10-01 ENCOUNTER — Encounter: Payer: Self-pay | Admitting: Family Medicine

## 2018-10-01 ENCOUNTER — Ambulatory Visit (INDEPENDENT_AMBULATORY_CARE_PROVIDER_SITE_OTHER): Payer: Medicare Other | Admitting: Family Medicine

## 2018-10-01 DIAGNOSIS — J4520 Mild intermittent asthma, uncomplicated: Secondary | ICD-10-CM

## 2018-10-01 DIAGNOSIS — E785 Hyperlipidemia, unspecified: Secondary | ICD-10-CM | POA: Diagnosis not present

## 2018-10-01 DIAGNOSIS — I10 Essential (primary) hypertension: Secondary | ICD-10-CM

## 2018-10-01 DIAGNOSIS — B009 Herpesviral infection, unspecified: Secondary | ICD-10-CM | POA: Diagnosis not present

## 2018-10-01 MED ORDER — PRAVASTATIN SODIUM 20 MG PO TABS: 20.0000 mg | ORAL_TABLET | Freq: Every day | ORAL | 3 refills | Status: DC

## 2018-10-01 MED ORDER — AMLODIPINE BESYLATE 5 MG PO TABS: 5.0000 mg | ORAL_TABLET | Freq: Every day | ORAL | 3 refills | Status: DC

## 2018-10-01 MED ORDER — VALSARTAN-HYDROCHLOROTHIAZIDE 160-12.5 MG PO TABS: 1.0000 | ORAL_TABLET | Freq: Every day | ORAL | 3 refills | Status: DC

## 2018-10-01 MED ORDER — ACYCLOVIR 400 MG PO TABS: 400.0000 mg | ORAL_TABLET | ORAL | 0 refills | Status: DC | PRN

## 2018-10-01 MED ORDER — ALBUTEROL SULFATE HFA 108 (90 BASE) MCG/ACT IN AERS: 2.0000 | INHALATION_SPRAY | RESPIRATORY_TRACT | 0 refills | Status: DC | PRN
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
## 2018-10-08 ENCOUNTER — Encounter: Payer: Self-pay | Admitting: Family Medicine

## 2018-10-08 DIAGNOSIS — E2839 Other primary ovarian failure: Secondary | ICD-10-CM

## 2018-10-09 MED ORDER — ESTRADIOL 1.53 MG/SPRAY TD SOLN: 1.0000 | Freq: Every day | TRANSDERMAL | 1 refills | Status: DC

## 2018-10-11 MED ORDER — ESTRADIOL 1.53 MG/SPRAY TD SOLN: 1.0000 | Freq: Every day | TRANSDERMAL | 1 refills | Status: DC

## 2018-10-11 MED ORDER — ESTRADIOL 1.53 MG/SPRAY TD SOLN: 1.0000 | Freq: Every day | TRANSDERMAL | 11 refills | Status: DC

## 2018-10-11 NOTE — Addendum Note (Signed)
Addended by: Kris Mouton on: 10/11/2018 02:35 PM   Modules accepted: Orders

## 2018-10-11 NOTE — Addendum Note (Signed)
Addended by: Kris Mouton on: 10/11/2018 04:03 PM   Modules accepted: Orders

## 2018-11-12 ENCOUNTER — Encounter: Payer: Self-pay | Admitting: Family Medicine

## 2018-11-13 ENCOUNTER — Other Ambulatory Visit: Payer: Self-pay | Admitting: Family Medicine

## 2018-11-13 DIAGNOSIS — E7800 Pure hypercholesterolemia, unspecified: Secondary | ICD-10-CM

## 2018-12-13 ENCOUNTER — Other Ambulatory Visit: Payer: Self-pay

## 2018-12-13 ENCOUNTER — Ambulatory Visit (INDEPENDENT_AMBULATORY_CARE_PROVIDER_SITE_OTHER): Payer: Medicare Other

## 2018-12-13 DIAGNOSIS — Z23 Encounter for immunization: Secondary | ICD-10-CM | POA: Diagnosis not present

## 2019-01-16 ENCOUNTER — Encounter: Payer: Self-pay | Admitting: Family Medicine

## 2019-01-22 DIAGNOSIS — D1801 Hemangioma of skin and subcutaneous tissue: Secondary | ICD-10-CM | POA: Diagnosis not present

## 2019-01-22 DIAGNOSIS — L570 Actinic keratosis: Secondary | ICD-10-CM | POA: Diagnosis not present

## 2019-01-22 DIAGNOSIS — D485 Neoplasm of uncertain behavior of skin: Secondary | ICD-10-CM | POA: Diagnosis not present

## 2019-03-31 ENCOUNTER — Encounter: Payer: Self-pay | Admitting: Family Medicine

## 2019-04-18 DIAGNOSIS — Z23 Encounter for immunization: Secondary | ICD-10-CM | POA: Diagnosis not present

## 2019-04-21 ENCOUNTER — Encounter: Payer: Self-pay | Admitting: Family Medicine

## 2019-05-13 DIAGNOSIS — Z23 Encounter for immunization: Secondary | ICD-10-CM | POA: Diagnosis not present

## 2019-05-22 ENCOUNTER — Encounter: Payer: Self-pay | Admitting: Family Medicine

## 2019-08-12 ENCOUNTER — Other Ambulatory Visit: Payer: Self-pay | Admitting: Family Medicine

## 2019-08-12 DIAGNOSIS — Z1231 Encounter for screening mammogram for malignant neoplasm of breast: Secondary | ICD-10-CM

## 2019-08-26 ENCOUNTER — Encounter: Payer: Self-pay | Admitting: Family Medicine

## 2019-09-10 ENCOUNTER — Encounter: Payer: Self-pay | Admitting: Family Medicine

## 2019-09-10 ENCOUNTER — Other Ambulatory Visit: Payer: Self-pay

## 2019-09-10 ENCOUNTER — Ambulatory Visit
Admission: RE | Admit: 2019-09-10 | Discharge: 2019-09-10 | Disposition: A | Discharge status: Home / Self Care | Payer: Medicare Other | Source: Ambulatory Visit | Attending: Family Medicine | Admitting: Family Medicine

## 2019-09-10 DIAGNOSIS — Z1231 Encounter for screening mammogram for malignant neoplasm of breast: Secondary | ICD-10-CM | POA: Diagnosis not present

## 2019-09-11 ENCOUNTER — Other Ambulatory Visit: Payer: Self-pay | Admitting: Family Medicine

## 2019-09-11 DIAGNOSIS — E2839 Other primary ovarian failure: Secondary | ICD-10-CM

## 2019-09-11 MED ORDER — ESTRADIOL 1.53 MG/SPRAY TD SOLN: 1.0000 | Freq: Every day | TRANSDERMAL | 11 refills | Status: DC

## 2019-09-11 NOTE — Telephone Encounter (Signed)
Pt is requesting a refill of Evamist Last refilled 10/11/2018 8.64mL x 11 refills. Last OV 10/01/2018 for [name] with [facility] Nurse 09/17/19  Please advise, thanks.

## 2019-09-12 ENCOUNTER — Other Ambulatory Visit: Payer: Self-pay | Admitting: Family Medicine

## 2019-09-12 DIAGNOSIS — R928 Other abnormal and inconclusive findings on diagnostic imaging of breast: Secondary | ICD-10-CM

## 2019-09-17 ENCOUNTER — Ambulatory Visit: Payer: Medicare Other

## 2019-09-21 ENCOUNTER — Other Ambulatory Visit: Payer: Self-pay | Admitting: Family Medicine

## 2019-09-21 DIAGNOSIS — E785 Hyperlipidemia, unspecified: Secondary | ICD-10-CM

## 2019-09-23 ENCOUNTER — Other Ambulatory Visit: Payer: Self-pay | Admitting: Family Medicine

## 2019-09-23 ENCOUNTER — Other Ambulatory Visit: Payer: Self-pay

## 2019-09-23 ENCOUNTER — Ambulatory Visit
Admission: RE | Admit: 2019-09-23 | Discharge: 2019-09-23 | Disposition: A | Discharge status: Home / Self Care | Payer: Medicare Other | Source: Ambulatory Visit | Attending: Family Medicine | Admitting: Family Medicine

## 2019-09-23 ENCOUNTER — Ambulatory Visit: Payer: Medicare Other

## 2019-09-23 DIAGNOSIS — E785 Hyperlipidemia, unspecified: Secondary | ICD-10-CM

## 2019-09-23 DIAGNOSIS — R928 Other abnormal and inconclusive findings on diagnostic imaging of breast: Secondary | ICD-10-CM

## 2019-09-23 DIAGNOSIS — R922 Inconclusive mammogram: Secondary | ICD-10-CM | POA: Diagnosis not present

## 2019-09-23 NOTE — Telephone Encounter (Signed)
Requesting 90 day supply instead of 30 day  Please advise, thanks.

## 2019-09-24 ENCOUNTER — Ambulatory Visit: Payer: Medicare Other

## 2019-09-24 ENCOUNTER — Ambulatory Visit (INDEPENDENT_AMBULATORY_CARE_PROVIDER_SITE_OTHER): Payer: Medicare Other

## 2019-09-24 DIAGNOSIS — Z0000 Encounter for general adult medical examination without abnormal findings: Secondary | ICD-10-CM

## 2019-09-24 NOTE — Progress Notes (Signed)
PCP notes:  Health Maintenance: Dexa- due   Abnormal Screenings: none   Patient concerns: none   Nurse concerns: none   Next PCP appt: none 

## 2019-09-24 NOTE — Patient Instructions (Signed)
Danielle Schmidt , Thank you for taking time to come for your Medicare Wellness Visit. I appreciate your ongoing commitment to your health goals. Please review the following plan we discussed and let me know if I can assist you in the future.   Screening recommendations/referrals: Colonoscopy: Up to date, completed 12/03/2012 Mammogram: Up to date, completed 09/10/2019 Bone Density: due Recommended yearly ophthalmology/optometry visit for glaucoma screening and checkup Recommended yearly dental visit for hygiene and checkup  Vaccinations: Influenza vaccine: Up to date, completed 12/13/2018 Pneumococcal vaccine: Completed series Tdap vaccine: decline Shingles vaccine: discussed    Advanced directives: Advance directive discussed with you today. Even though you declined this today please call our office should you change your mind and we can give you the proper paperwork for you to fill out.   Conditions/risks identified: hyperlipidemia  Next appointment: none   Preventive Care 69 Years and Older, Female Preventive care refers to lifestyle choices and visits with your health care provider that can promote health and wellness. What does preventive care include?  A yearly physical exam. This is also called an annual well check.  Dental exams once or twice a year.  Routine eye exams. Ask your health care provider how often you should have your eyes checked.  Personal lifestyle choices, including:  Daily care of your teeth and gums.  Regular physical activity.  Eating a healthy diet.  Avoiding tobacco and drug use.  Limiting alcohol use.  Practicing safe sex.  Taking low-dose aspirin every day.  Taking vitamin and mineral supplements as recommended by your health care provider. What happens during an annual well check? The services and screenings done by your health care provider during your annual well check will depend on your age, overall health, lifestyle risk factors, and  family history of disease. Counseling  Your health care provider may ask you questions about your:  Alcohol use.  Tobacco use.  Drug use.  Emotional well-being.  Home and relationship well-being.  Sexual activity.  Eating habits.  History of falls.  Memory and ability to understand (cognition).  Work and work Statistician.  Reproductive health. Screening  You may have the following tests or measurements:  Height, weight, and BMI.  Blood pressure.  Lipid and cholesterol levels. These may be checked every 5 years, or more frequently if you are over 55 years old.  Skin check.  Lung cancer screening. You may have this screening every year starting at age 7 if you have a 30-pack-year history of smoking and currently smoke or have quit within the past 15 years.  Fecal occult blood test (FOBT) of the stool. You may have this test every year starting at age 92.  Flexible sigmoidoscopy or colonoscopy. You may have a sigmoidoscopy every 5 years or a colonoscopy every 10 years starting at age 31.  Hepatitis C blood test.  Hepatitis B blood test.  Sexually transmitted disease (STD) testing.  Diabetes screening. This is done by checking your blood sugar (glucose) after you have not eaten for a while (fasting). You may have this done every 1-3 years.  Bone density scan. This is done to screen for osteoporosis. You may have this done starting at age 81.  Mammogram. This may be done every 1-2 years. Talk to your health care provider about how often you should have regular mammograms. Talk with your health care provider about your test results, treatment options, and if necessary, the need for more tests. Vaccines  Your health care provider may recommend  certain vaccines, such as:  Influenza vaccine. This is recommended every year.  Tetanus, diphtheria, and acellular pertussis (Tdap, Td) vaccine. You may need a Td booster every 10 years.  Zoster vaccine. You may need this  after age 19.  Pneumococcal 13-valent conjugate (PCV13) vaccine. One dose is recommended after age 17.  Pneumococcal polysaccharide (PPSV23) vaccine. One dose is recommended after age 50. Talk to your health care provider about which screenings and vaccines you need and how often you need them. This information is not intended to replace advice given to you by your health care provider. Make sure you discuss any questions you have with your health care provider. Document Released: 04/24/2015 Document Revised: 12/16/2015 Document Reviewed: 01/27/2015 Elsevier Interactive Patient Education  2017 [place] Prevention in the Home Falls can cause injuries. They can happen to people of all ages. There are many things you can do to make your home safe and to help prevent falls. What can I do on the outside of my home?  Regularly fix the edges of walkways and driveways and fix any cracks.  Remove anything that might make you trip as you walk through a door, such as a raised step or threshold.  Trim any bushes or trees on the path to your home.  Use bright outdoor lighting.  Clear any walking paths of anything that might make someone trip, such as rocks or tools.  Regularly check to see if handrails are loose or broken. Make sure that both sides of any steps have handrails.  Any raised decks and porches should have guardrails on the edges.  Have any leaves, snow, or ice cleared regularly.  Use sand or salt on walking paths during winter.  Clean up any spills in your garage right away. This includes oil or grease spills. What can I do in the bathroom?  Use night lights.  Install grab bars by the toilet and in the tub and shower. Do not use towel bars as grab bars.  Use non-skid mats or decals in the tub or shower.  If you need to sit down in the shower, use a plastic, non-slip stool.  Keep the floor dry. Clean up any water that spills on the floor as soon as it  happens.  Remove soap buildup in the tub or shower regularly.  Attach bath mats securely with double-sided non-slip rug tape.  Do not have throw rugs and other things on the floor that can make you trip. What can I do in the bedroom?  Use night lights.  Make sure that you have a light by your bed that is easy to reach.  Do not use any sheets or blankets that are too big for your bed. They should not hang down onto the floor.  Have a firm chair that has side arms. You can use this for support while you get dressed.  Do not have throw rugs and other things on the floor that can make you trip. What can I do in the kitchen?  Clean up any spills right away.  Avoid walking on wet floors.  Keep items that you use a lot in easy-to-reach places.  If you need to reach something above you, use a strong step stool that has a grab bar.  Keep electrical cords out of the way.  Do not use floor polish or wax that makes floors slippery. If you must use wax, use non-skid floor wax.  Do not have throw rugs and other  things on the floor that can make you trip. What can I do with my stairs?  Do not leave any items on the stairs.  Make sure that there are handrails on both sides of the stairs and use them. Fix handrails that are broken or loose. Make sure that handrails are as long as the stairways.  Check any carpeting to make sure that it is firmly attached to the stairs. Fix any carpet that is loose or worn.  Avoid having throw rugs at the top or bottom of the stairs. If you do have throw rugs, attach them to the floor with carpet tape.  Make sure that you have a light switch at the top of the stairs and the bottom of the stairs. If you do not have them, ask someone to add them for you. What else can I do to help prevent falls?  Wear shoes that:  Do not have high heels.  Have rubber bottoms.  Are comfortable and fit you well.  Are closed at the toe. Do not wear sandals.  If you  use a stepladder:  Make sure that it is fully opened. Do not climb a closed stepladder.  Make sure that both sides of the stepladder are locked into place.  Ask someone to hold it for you, if possible.  Clearly mark and make sure that you can see:  Any grab bars or handrails.  First and last steps.  Where the edge of each step is.  Use tools that help you move around (mobility aids) if they are needed. These include:  Canes.  Walkers.  Scooters.  Crutches.  Turn on the lights when you go into a dark area. Replace any light bulbs as soon as they burn out.  Set up your furniture so you have a clear path. Avoid moving your furniture around.  If any of your floors are uneven, fix them.  If there are any pets around you, be aware of where they are.  Review your medicines with your doctor. Some medicines can make you feel dizzy. This can increase your chance of falling. Ask your doctor what other things that you can do to help prevent falls. This information is not intended to replace advice given to you by your health care provider. Make sure you discuss any questions you have with your health care provider. Document Released: 01/22/2009 Document Revised: 09/03/2015 Document Reviewed: 05/02/2014 Elsevier Interactive Patient Education  2017 [employer].
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
## 2019-10-07 ENCOUNTER — Encounter: Payer: Self-pay | Admitting: Family Medicine

## 2019-10-17 DIAGNOSIS — H43813 Vitreous degeneration, bilateral: Secondary | ICD-10-CM | POA: Diagnosis not present

## 2019-10-22 ENCOUNTER — Other Ambulatory Visit: Payer: Self-pay | Admitting: Family Medicine

## 2019-10-22 DIAGNOSIS — E785 Hyperlipidemia, unspecified: Secondary | ICD-10-CM

## 2019-10-22 DIAGNOSIS — I10 Essential (primary) hypertension: Secondary | ICD-10-CM

## 2019-10-23 ENCOUNTER — Encounter: Payer: Self-pay | Admitting: Family Medicine

## 2019-10-25 MED ORDER — AMLODIPINE BESYLATE 5 MG PO TABS: 5.0000 mg | ORAL_TABLET | Freq: Every day | ORAL | 3 refills | Status: DC

## 2019-10-25 MED ORDER — VALSARTAN-HYDROCHLOROTHIAZIDE 160-12.5 MG PO TABS: 1.0000 | ORAL_TABLET | Freq: Every day | ORAL | 3 refills | Status: DC

## 2019-10-25 MED ORDER — PRAVASTATIN SODIUM 10 MG PO TABS: 10.0000 mg | ORAL_TABLET | Freq: Every day | ORAL | 3 refills | Status: DC

## 2019-10-25 NOTE — Telephone Encounter (Addendum)
Called patient to verify what medications she needs refilled to her pharmacy.  Pt is not wanting the 20mg  of Pravastatin d/t increased leg cramps/pain -- has been cutting the pills in half making 10mg . She is wanting 10mg  tablets called in.   Pt also needs the following meds refilled.  Amlodipine Valsartan   Appt 12/10/2019

## 2019-10-25 NOTE — Addendum Note (Signed)
Addended by: Virl Cagey on: 10/25/2019 04:47 PM   Modules accepted: Orders

## 2019-10-25 NOTE — Telephone Encounter (Signed)
Patient called in and has been scheduled for physical and labs, as she has already done the medicare well visit. Patient is wanting her medications filled till appointment on 9/1. Please advise.

## 2019-10-27 ENCOUNTER — Encounter: Payer: Self-pay | Admitting: Family Medicine

## 2019-10-28 ENCOUNTER — Other Ambulatory Visit: Payer: Self-pay | Admitting: Family Medicine

## 2019-10-28 DIAGNOSIS — E785 Hyperlipidemia, unspecified: Secondary | ICD-10-CM

## 2019-10-28 DIAGNOSIS — E2839 Other primary ovarian failure: Secondary | ICD-10-CM

## 2019-10-28 MED ORDER — ATORVASTATIN CALCIUM 20 MG PO TABS: 20.0000 mg | ORAL_TABLET | Freq: Every day | ORAL | 3 refills | Status: DC

## 2019-10-30 ENCOUNTER — Encounter: Payer: Self-pay | Admitting: Family Medicine

## 2019-12-02 ENCOUNTER — Other Ambulatory Visit: Payer: Self-pay | Admitting: Family Medicine

## 2019-12-02 DIAGNOSIS — Z905 Acquired absence of kidney: Secondary | ICD-10-CM

## 2019-12-02 DIAGNOSIS — E785 Hyperlipidemia, unspecified: Secondary | ICD-10-CM

## 2019-12-02 DIAGNOSIS — E669 Obesity, unspecified: Secondary | ICD-10-CM

## 2019-12-02 DIAGNOSIS — I10 Essential (primary) hypertension: Secondary | ICD-10-CM

## 2019-12-02 DIAGNOSIS — Z79890 Hormone replacement therapy (postmenopausal): Secondary | ICD-10-CM

## 2019-12-02 DIAGNOSIS — R739 Hyperglycemia, unspecified: Secondary | ICD-10-CM

## 2019-12-02 DIAGNOSIS — E2839 Other primary ovarian failure: Secondary | ICD-10-CM

## 2019-12-10 ENCOUNTER — Other Ambulatory Visit: Payer: Self-pay

## 2019-12-10 ENCOUNTER — Other Ambulatory Visit (INDEPENDENT_AMBULATORY_CARE_PROVIDER_SITE_OTHER): Payer: Medicare Other

## 2019-12-10 DIAGNOSIS — I10 Essential (primary) hypertension: Secondary | ICD-10-CM

## 2019-12-10 DIAGNOSIS — E785 Hyperlipidemia, unspecified: Secondary | ICD-10-CM | POA: Diagnosis not present

## 2019-12-10 DIAGNOSIS — E669 Obesity, unspecified: Secondary | ICD-10-CM

## 2019-12-10 DIAGNOSIS — Z905 Acquired absence of kidney: Secondary | ICD-10-CM | POA: Diagnosis not present

## 2019-12-10 DIAGNOSIS — R739 Hyperglycemia, unspecified: Secondary | ICD-10-CM

## 2019-12-10 DIAGNOSIS — E2839 Other primary ovarian failure: Secondary | ICD-10-CM | POA: Diagnosis not present

## 2019-12-10 LAB — CBC WITH DIFFERENTIAL/PLATELET
Basophils Absolute: 0 10*3/uL (ref 0.0–0.1)
Basophils Relative: 0.4 % (ref 0.0–3.0)
Eosinophils Absolute: 0.1 10*3/uL (ref 0.0–0.7)
Eosinophils Relative: 1.3 % (ref 0.0–5.0)
HCT: 42.3 % (ref 36.0–46.0)
Hemoglobin: 14 g/dL (ref 12.0–15.0)
Lymphocytes Relative: 44.8 % (ref 12.0–46.0)
Lymphs Abs: 2.6 10*3/uL (ref 0.7–4.0)
MCHC: 33 g/dL (ref 30.0–36.0)
MCV: 85.8 fl (ref 78.0–100.0)
Monocytes Absolute: 0.5 10*3/uL (ref 0.1–1.0)
Monocytes Relative: 8 % (ref 3.0–12.0)
Neutro Abs: 2.7 10*3/uL (ref 1.4–7.7)
Neutrophils Relative %: 45.5 % (ref 43.0–77.0)
Platelets: 212 10*3/uL (ref 150.0–400.0)
RBC: 4.93 Mil/uL (ref 3.87–5.11)
RDW: 12.9 % (ref 11.5–15.5)
WBC: 5.9 10*3/uL (ref 4.0–10.5)

## 2019-12-10 LAB — COMPREHENSIVE METABOLIC PANEL
ALT: 14 U/L (ref 0–35)
AST: 13 U/L (ref 0–37)
Albumin: 4.3 g/dL (ref 3.5–5.2)
Alkaline Phosphatase: 65 U/L (ref 39–117)
BUN: 18 mg/dL (ref 6–23)
CO2: 29 mEq/L (ref 19–32)
Calcium: 9.4 mg/dL (ref 8.4–10.5)
Chloride: 101 mEq/L (ref 96–112)
Creatinine, Ser: 0.83 mg/dL (ref 0.40–1.20)
GFR: 68.14 mL/min (ref >60.00)
Glucose, Bld: 92 mg/dL (ref 70–99)
Potassium: 3.8 mEq/L (ref 3.5–5.1)
Sodium: 138 mEq/L (ref 135–145)
Total Bilirubin: 0.5 mg/dL (ref 0.2–1.2)
Total Protein: 6.4 g/dL (ref 6.0–8.3)

## 2019-12-10 LAB — TSH: TSH: 3.37 u[IU]/mL (ref 0.35–4.50)

## 2019-12-10 LAB — LIPID PANEL
Cholesterol: 193 mg/dL (ref 0–200)
HDL: 58.2 mg/dL (ref >39.00)
LDL Cholesterol: 114 mg/dL — ABNORMAL HIGH (ref 0–99)
NonHDL: 135.26
Total CHOL/HDL Ratio: 3
Triglycerides: 108 mg/dL (ref 0.0–149.0)
VLDL: 21.6 mg/dL (ref 0.0–40.0)

## 2019-12-10 LAB — HEMOGLOBIN A1C: Hgb A1c MFr Bld: 5.4 % (ref 4.6–6.5)

## 2019-12-11 ENCOUNTER — Encounter: Payer: Self-pay | Admitting: Family Medicine

## 2019-12-11 ENCOUNTER — Ambulatory Visit (INDEPENDENT_AMBULATORY_CARE_PROVIDER_SITE_OTHER): Payer: Medicare Other | Admitting: Family Medicine

## 2019-12-11 VITALS — BP 120/76 | HR 71 | Temp 97.7°F | Wt 183.2 lb

## 2019-12-11 DIAGNOSIS — I10 Essential (primary) hypertension: Secondary | ICD-10-CM

## 2019-12-11 DIAGNOSIS — E6609 Other obesity due to excess calories: Secondary | ICD-10-CM | POA: Diagnosis not present

## 2019-12-11 DIAGNOSIS — Z6832 Body mass index (BMI) 32.0-32.9, adult: Secondary | ICD-10-CM

## 2019-12-11 DIAGNOSIS — Z0000 Encounter for general adult medical examination without abnormal findings: Secondary | ICD-10-CM | POA: Diagnosis not present

## 2019-12-11 DIAGNOSIS — E7841 Elevated Lipoprotein(a): Secondary | ICD-10-CM

## 2019-12-11 DIAGNOSIS — E2839 Other primary ovarian failure: Secondary | ICD-10-CM | POA: Diagnosis not present

## 2019-12-11 NOTE — Patient Instructions (Signed)
Good to see you today  Keep up the good work with your eating plan  Please call and schedule your bone density test   [facility]

## 2019-12-11 NOTE — Progress Notes (Signed)
Subjective:    Patient ID: Danielle Schmidt, female    DOB: 1951/01/20, 69 y.o.   MRN: [id number]  HPI This is a 69 yo female who presents today for well woman exam.    Last CPE- AWV earlier this year Mammo- 09/23/2019 Pap- hysterectomy Colonoscopy- 12/04/2022 Tdap- unsure, recommended she get at her pharmacy Flu- annual Covid 19 vaccine- fully vaccinated Eye- annual Dental- regular Exercise- walking    Hyperlipidemia- currently on atrovastatin 20 mg. No side effects. The 10-year ASCVD risk score Mikey Bussing DC Brooke Bonito., et al., 2013) is: 9.8%   Values used to calculate the score:     Age: 30 years     Sex: Female     Is Non-Hispanic African American: No     Diabetic: No     Tobacco smoker: No     Systolic Blood Pressure: 062 mmHg     Is BP treated: Yes     HDL Cholesterol: 58.2 mg/dL     Total Cholesterol: 193 mg/dL   HTN-currently on amlodipine 5 mg, valsartan-hydrochlorothiazide 160-12.5.  Denies any side effects.  Obesity-has been doing biplane eating plan, decreasing processed food and carbohydrates.  Has lost 6 pounds in the last 6 months.  Estrogen deficiency-uses Evamist 1.53 mg/day once a day.  Has good symptom control.  Review of Systems  Constitutional: Negative.   HENT: Negative.   Eyes: Negative.   Respiratory: Negative.   Cardiovascular: Negative.   Endocrine: Negative.   Genitourinary: Negative.   Musculoskeletal: Negative.   Skin: Negative.   Allergic/Immunologic: Negative.   Neurological: Negative.   Hematological: Negative.   Psychiatric/Behavioral: Negative.        Objective:   Physical Exam Physical Exam  Constitutional: She is oriented to person, place, and time. She appears well-developed and well-nourished. No distress.  HENT:  Head: Normocephalic and atraumatic.  Right Ear: External ear normal. TM normal.  Left Ear: External ear normal. TM normal.  Nose: Nose normal.  Mouth/Throat: Oropharynx is clear and moist. No oropharyngeal exudate.   Eyes: Conjunctivae are normal.   Neck: Normal range of motion. Neck supple. No JVD present. No thyromegaly present.  Cardiovascular: Normal rate, regular rhythm, normal heart sounds and intact distal pulses.   Pulmonary/Chest: Effort normal and breath sounds normal. Right breast exhibits no inverted nipple, no mass, no nipple discharge, no skin change and no tenderness. Left breast exhibits no inverted nipple, no mass, no nipple discharge, no skin change and no tenderness. Breasts are symmetrical.  Abdominal: Soft. Bowel sounds are normal. She exhibits no distension and no mass. There is no tenderness. There is no rebound and no guarding.   Musculoskeletal: Normal range of motion. She exhibits no edema or tenderness.  Lymphadenopathy:    She has no cervical adenopathy.  Neurological: She is alert and oriented to person, place, and time.   Skin: Skin is warm and dry. She is not diaphoretic.  Psychiatric: She has a normal mood and affect. Her behavior is normal. Judgment and thought content normal.  Vitals reviewed.     BP 120/76   Pulse 71   Temp 97.7 F (36.5 C) (Temporal)   Wt 183 lb 4 oz (83.1 kg)   LMP 12/03/1988 (Approximate)   SpO2 97%   BMI 32.72 kg/m  Wt Readings from Last 3 Encounters:  12/11/19 183 lb 4 oz (83.1 kg)  02/28/18 189 lb 12.8 oz (86.1 kg)  08/23/17 182 lb 4 oz (82.7 kg)       Assessment &  Plan:  1. Well woman exam (no gynecological exam) -- Discussed and encouraged healthy lifestyle choices- adequate sleep, regular exercise, stress management and healthy food choices.    2. Estrogen deficiency -Currently using topical estrogen.  Discussed weaning this and stopping it as she is not likely to continue to have vasomotor symptoms - DG Bone Density; Future  3. Essential hypertension -Well-controlled on current medications  4. Elevated lipoprotein(a) -Discussed increasing her atorvastatin to 40 mg, she prefers to wait and work on her diet  5. Class 1  obesity due to excess calories with serious comorbidity and body mass index (BMI) of 32.0 to 32.9 in adult -Has had some success with weight loss with her current eating plan, encouraged her to continue as well as increase activity as tolerated  -Follow-up in 6 months  This visit occurred during the SARS-CoV-2 public health emergency.  Safety protocols were in place, including screening questions prior to the visit, additional usage of staff PPE, and extensive cleaning of exam room while observing appropriate contact time as indicated for disinfecting solutions.      Clarene Reamer, FNP-BC  [facility]  12/19/2019 8:16 AM

## 2019-12-13 ENCOUNTER — Other Ambulatory Visit: Payer: Self-pay

## 2019-12-13 ENCOUNTER — Ambulatory Visit
Admission: RE | Admit: 2019-12-13 | Discharge: 2019-12-13 | Disposition: A | Discharge status: Home / Self Care | Payer: Medicare Other | Source: Ambulatory Visit | Attending: Family Medicine | Admitting: Family Medicine

## 2019-12-13 DIAGNOSIS — Z780 Asymptomatic menopausal state: Secondary | ICD-10-CM | POA: Diagnosis not present

## 2019-12-13 DIAGNOSIS — E2839 Other primary ovarian failure: Secondary | ICD-10-CM

## 2019-12-19 ENCOUNTER — Encounter: Payer: Self-pay | Admitting: Family Medicine

## 2019-12-19 DIAGNOSIS — E2839 Other primary ovarian failure: Secondary | ICD-10-CM | POA: Insufficient documentation

## 2019-12-23 DIAGNOSIS — Z23 Encounter for immunization: Secondary | ICD-10-CM | POA: Diagnosis not present

## 2020-01-17 DIAGNOSIS — Z23 Encounter for immunization: Secondary | ICD-10-CM | POA: Diagnosis not present

## 2020-01-20 ENCOUNTER — Encounter: Payer: Self-pay | Admitting: Family Medicine

## 2020-01-24 ENCOUNTER — Encounter: Payer: Self-pay | Admitting: Family Medicine

## 2020-01-27 ENCOUNTER — Encounter: Payer: Self-pay | Admitting: *Deleted

## 2020-01-28 ENCOUNTER — Encounter: Payer: Self-pay | Admitting: Dermatology

## 2020-01-28 ENCOUNTER — Ambulatory Visit (INDEPENDENT_AMBULATORY_CARE_PROVIDER_SITE_OTHER): Payer: Medicare Other | Admitting: Dermatology

## 2020-01-28 ENCOUNTER — Other Ambulatory Visit: Payer: Self-pay

## 2020-01-28 DIAGNOSIS — Z87898 Personal history of other specified conditions: Secondary | ICD-10-CM

## 2020-01-28 DIAGNOSIS — L570 Actinic keratosis: Secondary | ICD-10-CM

## 2020-01-28 DIAGNOSIS — Z86018 Personal history of other benign neoplasm: Secondary | ICD-10-CM | POA: Diagnosis not present

## 2020-01-28 DIAGNOSIS — Z85828 Personal history of other malignant neoplasm of skin: Secondary | ICD-10-CM | POA: Diagnosis not present

## 2020-01-28 DIAGNOSIS — Z8589 Personal history of malignant neoplasm of other organs and systems: Secondary | ICD-10-CM

## 2020-01-28 DIAGNOSIS — Z1283 Encounter for screening for malignant neoplasm of skin: Secondary | ICD-10-CM | POA: Diagnosis not present

## 2020-02-14 ENCOUNTER — Encounter: Payer: Self-pay | Admitting: Family Medicine

## 2020-02-16 ENCOUNTER — Other Ambulatory Visit: Payer: Self-pay | Admitting: Family Medicine

## 2020-03-07 ENCOUNTER — Encounter: Payer: Self-pay | Admitting: Dermatology

## 2020-03-07 NOTE — Progress Notes (Signed)
   Follow-Up Visit   Subjective  Danielle Schmidt is a 69 y.o. female who presents for the following: Annual Exam (RIGHT CHEST SCALE, RIGHT BROW SCALE).  General skin check Location:  Duration:  Quality:  Associated Signs/Symptoms: Modifying Factors:  Severity:  Timing: Context:   Objective  Well appearing patient in no apparent distress; mood and affect are within normal limits.  A full examination was performed including scalp, head, eyes, ears, nose, lips, neck, chest, axillae, abdomen, back, buttocks, bilateral upper extremities, bilateral lower extremities, hands, feet, fingers, toes, fingernails, and toenails. All findings within normal limits unless otherwise noted below.   Assessment & Plan    History of atypical nevus Right Hip (side) - Posterior  Self examine skin twice annually  History of squamous cell carcinoma Right Forehead  Annual skin examination  History of basal cell cancer Right Forehead  Recheck as needed.  Encounter for screening for malignant neoplasm of skin Chest - Medial (Center)  Yearly skin exam  AK (actinic keratosis) (2) Left Eyebrow; Right Collarbone  Destruction of lesion - Left Eyebrow, Right Collarbone Complexity: simple   Destruction method: cryotherapy   Informed consent: discussed and consent obtained   Timeout:  patient name, date of birth, surgical site, and procedure verified Lesion destroyed using liquid nitrogen: Yes   Cryotherapy cycles:  5 Outcome: patient tolerated procedure well with no complications   Post-procedure details: wound care instructions given       I, Lavonna Monarch, MD, have reviewed all documentation for this visit.  The documentation on 03/07/20 for the exam, diagnosis, procedures, and orders are all accurate and complete.

## 2020-05-08 DIAGNOSIS — Z7189 Other specified counseling: Secondary | ICD-10-CM | POA: Diagnosis not present

## 2020-05-08 DIAGNOSIS — J069 Acute upper respiratory infection, unspecified: Secondary | ICD-10-CM | POA: Diagnosis not present

## 2020-05-08 DIAGNOSIS — Z20822 Contact with and (suspected) exposure to covid-19: Secondary | ICD-10-CM | POA: Diagnosis not present

## 2020-07-16 DIAGNOSIS — Z20828 Contact with and (suspected) exposure to other viral communicable diseases: Secondary | ICD-10-CM | POA: Diagnosis not present

## 2020-08-18 ENCOUNTER — Other Ambulatory Visit: Payer: Self-pay | Admitting: Family Medicine

## 2020-08-18 DIAGNOSIS — Z1231 Encounter for screening mammogram for malignant neoplasm of breast: Secondary | ICD-10-CM

## 2020-09-24 ENCOUNTER — Ambulatory Visit: Payer: Medicare Other

## 2020-10-06 DIAGNOSIS — Z23 Encounter for immunization: Secondary | ICD-10-CM | POA: Diagnosis not present

## 2020-10-13 ENCOUNTER — Ambulatory Visit
Admission: RE | Admit: 2020-10-13 | Discharge: 2020-10-13 | Disposition: A | Discharge status: Home / Self Care | Payer: Medicare Other | Source: Ambulatory Visit | Attending: Family Medicine | Admitting: Family Medicine

## 2020-10-13 ENCOUNTER — Ambulatory Visit: Payer: Medicare Other

## 2020-10-13 ENCOUNTER — Other Ambulatory Visit: Payer: Self-pay

## 2020-10-13 DIAGNOSIS — Z1231 Encounter for screening mammogram for malignant neoplasm of breast: Secondary | ICD-10-CM

## 2020-10-19 ENCOUNTER — Other Ambulatory Visit: Payer: Self-pay

## 2020-10-19 DIAGNOSIS — I10 Essential (primary) hypertension: Secondary | ICD-10-CM

## 2020-10-19 MED ORDER — VALSARTAN-HYDROCHLOROTHIAZIDE 160-12.5 MG PO TABS: 1.0000 | ORAL_TABLET | Freq: Every day | ORAL | 3 refills | Status: DC

## 2020-10-19 NOTE — Telephone Encounter (Signed)
Last OV 12/11/2019 Next OV 11/24/2020 (Dr. Damita Dunnings) Last Filled 10/25/2019

## 2020-11-04 ENCOUNTER — Encounter: Payer: Self-pay | Admitting: Family Medicine

## 2020-11-04 ENCOUNTER — Other Ambulatory Visit: Payer: Self-pay | Admitting: Family Medicine

## 2020-11-04 DIAGNOSIS — I10 Essential (primary) hypertension: Secondary | ICD-10-CM

## 2020-11-04 DIAGNOSIS — R739 Hyperglycemia, unspecified: Secondary | ICD-10-CM

## 2020-11-10 ENCOUNTER — Other Ambulatory Visit: Payer: Medicare Other

## 2020-11-10 ENCOUNTER — Other Ambulatory Visit (INDEPENDENT_AMBULATORY_CARE_PROVIDER_SITE_OTHER): Payer: Medicare Other

## 2020-11-10 ENCOUNTER — Other Ambulatory Visit: Payer: Self-pay

## 2020-11-10 DIAGNOSIS — R739 Hyperglycemia, unspecified: Secondary | ICD-10-CM | POA: Diagnosis not present

## 2020-11-10 DIAGNOSIS — I10 Essential (primary) hypertension: Secondary | ICD-10-CM

## 2020-11-10 LAB — CBC WITH DIFFERENTIAL/PLATELET
Basophils Absolute: 0.1 10*3/uL (ref 0.0–0.1)
Basophils Relative: 0.9 % (ref 0.0–3.0)
Eosinophils Absolute: 0.1 10*3/uL (ref 0.0–0.7)
Eosinophils Relative: 1.8 % (ref 0.0–5.0)
HCT: 41.6 % (ref 36.0–46.0)
Hemoglobin: 13.6 g/dL (ref 12.0–15.0)
Lymphocytes Relative: 41.7 % (ref 12.0–46.0)
Lymphs Abs: 3 10*3/uL (ref 0.7–4.0)
MCHC: 32.7 g/dL (ref 30.0–36.0)
MCV: 86.5 fl (ref 78.0–100.0)
Monocytes Absolute: 0.5 10*3/uL (ref 0.1–1.0)
Monocytes Relative: 6.7 % (ref 3.0–12.0)
Neutro Abs: 3.5 10*3/uL (ref 1.4–7.7)
Neutrophils Relative %: 48.9 % (ref 43.0–77.0)
Platelets: 217 10*3/uL (ref 150.0–400.0)
RBC: 4.82 Mil/uL (ref 3.87–5.11)
RDW: 13.1 % (ref 11.5–15.5)
WBC: 7.1 10*3/uL (ref 4.0–10.5)

## 2020-11-10 LAB — COMPREHENSIVE METABOLIC PANEL
ALT: 17 U/L (ref 0–35)
AST: 15 U/L (ref 0–37)
Albumin: 4.3 g/dL (ref 3.5–5.2)
Alkaline Phosphatase: 82 U/L (ref 39–117)
BUN: 18 mg/dL (ref 6–23)
CO2: 28 mEq/L (ref 19–32)
Calcium: 9.3 mg/dL (ref 8.4–10.5)
Chloride: 100 mEq/L (ref 96–112)
Creatinine, Ser: 0.8 mg/dL (ref 0.40–1.20)
GFR: 74.85 mL/min (ref >60.00)
Glucose, Bld: 91 mg/dL (ref 70–99)
Potassium: 4.2 mEq/L (ref 3.5–5.1)
Sodium: 138 mEq/L (ref 135–145)
Total Bilirubin: 0.5 mg/dL (ref 0.2–1.2)
Total Protein: 6.8 g/dL (ref 6.0–8.3)

## 2020-11-10 LAB — HEMOGLOBIN A1C: Hgb A1c MFr Bld: 5.6 % (ref 4.6–6.5)

## 2020-11-10 LAB — LIPID PANEL
Cholesterol: 223 mg/dL — ABNORMAL HIGH (ref 0–200)
HDL: 65.6 mg/dL (ref >39.00)
LDL Cholesterol: 127 mg/dL — ABNORMAL HIGH (ref 0–99)
NonHDL: 157.05
Total CHOL/HDL Ratio: 3
Triglycerides: 148 mg/dL (ref 0.0–149.0)
VLDL: 29.6 mg/dL (ref 0.0–40.0)

## 2020-11-10 LAB — TSH: TSH: 3.2 u[IU]/mL (ref 0.35–5.50)

## 2020-11-24 ENCOUNTER — Encounter: Payer: Self-pay | Admitting: Family Medicine

## 2020-11-24 ENCOUNTER — Other Ambulatory Visit: Payer: Self-pay

## 2020-11-24 ENCOUNTER — Ambulatory Visit (INDEPENDENT_AMBULATORY_CARE_PROVIDER_SITE_OTHER): Payer: Medicare Other | Admitting: Family Medicine

## 2020-11-24 ENCOUNTER — Ambulatory Visit: Payer: Medicare Other | Admitting: Family Medicine

## 2020-11-24 VITALS — BP 124/70 | HR 70 | Temp 98.0°F | Ht 63.0 in | Wt 190.0 lb

## 2020-11-24 DIAGNOSIS — Z780 Asymptomatic menopausal state: Secondary | ICD-10-CM

## 2020-11-24 DIAGNOSIS — B001 Herpesviral vesicular dermatitis: Secondary | ICD-10-CM

## 2020-11-24 DIAGNOSIS — J4520 Mild intermittent asthma, uncomplicated: Secondary | ICD-10-CM

## 2020-11-24 DIAGNOSIS — E785 Hyperlipidemia, unspecified: Secondary | ICD-10-CM

## 2020-11-24 DIAGNOSIS — B009 Herpesviral infection, unspecified: Secondary | ICD-10-CM

## 2020-11-24 DIAGNOSIS — Z9109 Other allergy status, other than to drugs and biological substances: Secondary | ICD-10-CM

## 2020-11-24 DIAGNOSIS — Z7189 Other specified counseling: Secondary | ICD-10-CM

## 2020-11-24 DIAGNOSIS — I10 Essential (primary) hypertension: Secondary | ICD-10-CM

## 2020-11-24 DIAGNOSIS — Z905 Acquired absence of kidney: Secondary | ICD-10-CM | POA: Diagnosis not present

## 2020-11-24 DIAGNOSIS — E2839 Other primary ovarian failure: Secondary | ICD-10-CM

## 2020-11-24 DIAGNOSIS — Z0000 Encounter for general adult medical examination without abnormal findings: Secondary | ICD-10-CM

## 2020-11-24 MED ORDER — ACYCLOVIR 400 MG PO TABS: 400.0000 mg | ORAL_TABLET | Freq: Two times a day (BID) | ORAL | 1 refills | Status: DC | PRN

## 2020-11-24 MED ORDER — ATORVASTATIN CALCIUM 20 MG PO TABS: 10.0000 mg | ORAL_TABLET | Freq: Every day | ORAL | Status: DC

## 2020-11-24 NOTE — Patient Instructions (Addendum)
Try skipping 1 day a week of the estrogen spray for 2 weeks.   Then try skipping 2 days a week for about 2 weeks.   Gradually try to wean off.    Keep the appointment with cardiology.   Take care.  Glad to see you.

## 2020-11-24 NOTE — Progress Notes (Signed)
This visit occurred during the SARS-CoV-2 public health emergency.  Safety protocols were in place, including screening questions prior to the visit, additional usage of staff PPE, and extensive cleaning of exam room while observing appropriate contact time as indicated for disinfecting solutions.  Transfer of care.  History of nephrectomy.  Creatinine stable.  Discussed.  Hypertension:    Using medication without problems or lightheadedness: yes Chest pain with exertion:no Edema:no Short of breath:no She has cards f/u pending.  FH CAD noted, sister.    Elevated Cholesterol: Using medications without problems: yes Muscle aches:  aches on '20mg'$  lipitor, tolerated '10mg'$ .   Diet compliance: encouraged Exercise: encouraged  HRT.  S/p hysterectomy.  Longstanding use, not having hot flashes.  Sister with h/o breast cancer.    H/o fever blisters, rare use of acyclovir.    H/o SABA use for asthma/allergies.  Used prn, not daily.    Advance directive d/w pt.  Husband designated if patient were incapacitated.   Tetanus d/w pt.   Zostavax prev done.   PNA up to date Flu done yearly  Covid vaccine prev done.   Mammogram 2022 DXA 2021 Colonoscopy 2014  Meds, vitals, and allergies reviewed.   PMH and SH reviewed  ROS: Per HPI unless specifically indicated in ROS section   GEN: nad, alert and oriented HEENT: ncat NECK: supple w/o LA CV: rrr. PULM: ctab, no inc wob ABD: soft, +bs EXT: no edema SKIN: Well-perfused.  30 minutes were devoted to patient care in this encounter (this includes time spent reviewing the patient's file/history, interviewing and examining the patient, counseling/reviewing plan with patient).

## 2020-11-25 DIAGNOSIS — Z0000 Encounter for general adult medical examination without abnormal findings: Secondary | ICD-10-CM | POA: Insufficient documentation

## 2020-11-25 DIAGNOSIS — Z7189 Other specified counseling: Secondary | ICD-10-CM | POA: Insufficient documentation

## 2020-11-25 DIAGNOSIS — Z9109 Other allergy status, other than to drugs and biological substances: Secondary | ICD-10-CM | POA: Insufficient documentation

## 2020-11-25 DIAGNOSIS — B001 Herpesviral vesicular dermatitis: Secondary | ICD-10-CM | POA: Insufficient documentation

## 2020-11-25 NOTE — Assessment & Plan Note (Signed)
Lungs are clear.  Continue as needed use of albuterol.  Update me as needed.  Not needing medication frequently.

## 2020-11-25 NOTE — Assessment & Plan Note (Signed)
Advance directive- d/w pt. Husband designated if patient were incapacitated.  

## 2020-11-25 NOTE — Assessment & Plan Note (Signed)
  History of nephrectomy.  Creatinine stable.  Discussed.

## 2020-11-25 NOTE — Assessment & Plan Note (Signed)
Continue atorvastatin 10 mg a day for now.  Labs discussed with patient.  I will await cardiology input.  Continue work on diet and exercise.

## 2020-11-25 NOTE — Assessment & Plan Note (Signed)
Continue as needed acyclovir and update me as needed.

## 2020-11-25 NOTE — Assessment & Plan Note (Signed)
S/p hysterectomy.  Longstanding use, not having hot flashes.  Sister with h/o breast cancer.   Discussed with patient about slow taper of estrogen and she will update me as needed.

## 2020-11-25 NOTE — Assessment & Plan Note (Signed)
Advance directive d/w pt.  Husband designated if patient were incapacitated.   Tetanus d/w pt.   Zostavax prev done.   PNA up to date Flu done yearly  Covid vaccine prev done.   Mammogram 2022 DXA 2021 Colonoscopy 2014

## 2020-11-25 NOTE — Assessment & Plan Note (Signed)
Continue valsartan hydrochlorothiazide.  Labs discussed with patient.  Continue work on diet and exercise.  She has cardiology follow-up pending given her family history of coronary disease.  I will await cardiology input about potential stress testing versus calcium scoring.

## 2020-12-29 ENCOUNTER — Ambulatory Visit: Payer: Medicare Other | Admitting: Cardiovascular Disease

## 2020-12-29 ENCOUNTER — Ambulatory Visit (INDEPENDENT_AMBULATORY_CARE_PROVIDER_SITE_OTHER): Payer: Medicare Other | Admitting: Cardiovascular Disease

## 2020-12-29 ENCOUNTER — Other Ambulatory Visit: Payer: Self-pay

## 2020-12-29 ENCOUNTER — Encounter: Payer: Self-pay | Admitting: Cardiovascular Disease

## 2020-12-29 VITALS — BP 138/82 | HR 71 | Ht 64.0 in | Wt 192.5 lb

## 2020-12-29 DIAGNOSIS — I119 Hypertensive heart disease without heart failure: Secondary | ICD-10-CM

## 2020-12-29 DIAGNOSIS — E669 Obesity, unspecified: Secondary | ICD-10-CM | POA: Diagnosis not present

## 2020-12-29 DIAGNOSIS — I10 Essential (primary) hypertension: Secondary | ICD-10-CM | POA: Diagnosis not present

## 2020-12-29 DIAGNOSIS — E782 Mixed hyperlipidemia: Secondary | ICD-10-CM | POA: Diagnosis not present

## 2020-12-29 DIAGNOSIS — Z8342 Family history of familial hypercholesterolemia: Secondary | ICD-10-CM

## 2020-12-29 MED ORDER — ROSUVASTATIN CALCIUM 5 MG PO TABS: 5.0000 mg | ORAL_TABLET | Freq: Every day | ORAL | 3 refills | Status: DC

## 2020-12-29 NOTE — Patient Instructions (Addendum)
Medication Instructions:  Please START Crestor 5 mg daily  If you need a refill on your cardiac medications before your next appointment, please call your pharmacy.   Lab work: No new labs needed  Testing/Procedures: We have order a CT coronary calcium score (you may see results on your MyChart account, but we will call via phone with the results) This is a $99 out of pocket expense   This procedure uses special x-ray equipment to produce pictures of the coronary arteries to determine if they are blocked or narrowed by the buildup of plaque - an indicator for atherosclerosis or coronary artery disease (CAD).  Please call [phone] to schedule at your earliest convince   This is done at our [facility] in [facility] [place], [place] [address]   Follow-Up: At [facility], you and your health needs are our priority.  As part of our continuing mission to provide you with exceptional heart care, we have created designated Provider Care Teams.  These Care Teams include your primary Cardiologist (physician) and Advanced Practice Providers (APPs -  Physician Assistants and Nurse Practitioners) who all work together to provide you with the care you need, when you need it.  You will need a follow up appointment  as needed  Providers on your designated Care Team:   Murray Hodgkins, NP Christell Faith, PA-C Marrianne Mood, PA-C Cadence Franklin, [place]  COVID-19 Vaccine Information can be found at: [email or web address] For questions related to vaccine distribution or appointments, please email [email or web address] or call [phone].
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
## 2021-01-09 ENCOUNTER — Encounter: Payer: Self-pay | Admitting: Family Medicine

## 2021-01-19 ENCOUNTER — Ambulatory Visit
Admission: RE | Admit: 2021-01-19 | Discharge: 2021-01-19 | Disposition: A | Discharge status: Home / Self Care | Payer: Medicare Other | Source: Ambulatory Visit | Attending: Cardiovascular Disease | Admitting: Cardiovascular Disease

## 2021-01-19 ENCOUNTER — Other Ambulatory Visit: Payer: Self-pay

## 2021-01-19 DIAGNOSIS — Z23 Encounter for immunization: Secondary | ICD-10-CM | POA: Diagnosis not present

## 2021-01-19 DIAGNOSIS — E669 Obesity, unspecified: Secondary | ICD-10-CM

## 2021-01-19 DIAGNOSIS — E782 Mixed hyperlipidemia: Secondary | ICD-10-CM

## 2021-01-19 DIAGNOSIS — I10 Essential (primary) hypertension: Secondary | ICD-10-CM

## 2021-01-19 DIAGNOSIS — I119 Hypertensive heart disease without heart failure: Secondary | ICD-10-CM

## 2021-01-19 DIAGNOSIS — Z8342 Family history of familial hypercholesterolemia: Secondary | ICD-10-CM

## 2021-01-20 ENCOUNTER — Encounter: Payer: Self-pay | Admitting: Family Medicine

## 2021-01-26 ENCOUNTER — Telehealth: Payer: Self-pay

## 2021-01-26 NOTE — Telephone Encounter (Signed)
Left detail message on VM of pt's recent results okay by DPR, Dr. Rockey Situ advised   "CT coronary calcium score  Score of 0  Excellent finding, indicating no calcified coronary atherosclerotic plaque noted  Some aortic atherosclerosis noted "  At this time, no further recommendations or medications changes, advised to call office for any concerns or questions, otherwise will see at next visit.   Results released to MyChart by Dr. Rockey Situ Seen by patient Danielle Schmidt on 01/26/2021  8:50 AM

## 2021-01-26 NOTE — Progress Notes (Signed)
Results released to MyChart by Dr. Rockey Situ Seen by patient Danielle Schmidt on 01/26/2021 8:50 AM LDM on cell VM of results (DPR approved) Advised to call office with any concerns

## 2021-02-02 ENCOUNTER — Encounter: Payer: Self-pay | Admitting: Dermatology

## 2021-02-02 ENCOUNTER — Ambulatory Visit (INDEPENDENT_AMBULATORY_CARE_PROVIDER_SITE_OTHER): Payer: Medicare Other | Admitting: Dermatology

## 2021-02-02 ENCOUNTER — Other Ambulatory Visit: Payer: Self-pay

## 2021-02-02 DIAGNOSIS — Z1283 Encounter for screening for malignant neoplasm of skin: Secondary | ICD-10-CM | POA: Diagnosis not present

## 2021-02-02 DIAGNOSIS — L820 Inflamed seborrheic keratosis: Secondary | ICD-10-CM | POA: Diagnosis not present

## 2021-02-02 DIAGNOSIS — L821 Other seborrheic keratosis: Secondary | ICD-10-CM | POA: Diagnosis not present

## 2021-02-02 DIAGNOSIS — L988 Other specified disorders of the skin and subcutaneous tissue: Secondary | ICD-10-CM | POA: Diagnosis not present

## 2021-02-02 MED ORDER — TRETINOIN 0.1 % EX CREA: TOPICAL_CREAM | Freq: Every day | CUTANEOUS | 3 refills | Status: DC

## 2021-02-16 ENCOUNTER — Encounter: Payer: Self-pay | Admitting: Dermatology

## 2021-02-16 NOTE — Progress Notes (Signed)
   Follow-Up Visit   Subjective  Danielle Schmidt is a 70 y.o. female who presents for the following: Annual Exam (No new concerns).  General skin examination, several issues to discuss Location:  Duration:  Quality:  Associated Signs/Symptoms: Modifying Factors:  Severity:  Timing: Context:   Objective  Well appearing patient in no apparent distress; mood and affect are within normal limits. Torso - Posterior (Back) Full body skin examination: No atypical pigmented lesions or nonmelanoma skin cancer.  Left Abdomen (side) - Lower 5 mm noninflamed flattopped keratotic Glock papule  Right Forearm - Anterior Inflamed pink-tan keratotic 6 mm papule  Right Malar Cheek Facial rhytids    A full examination was performed including scalp, head, eyes, ears, nose, lips, neck, chest, axillae, abdomen, back, buttocks, bilateral upper extremities, bilateral lower extremities, hands, feet, fingers, toes, fingernails, and toenails. All findings within normal limits unless otherwise noted below.  Areas beneath undergarments not fully examined   Assessment & Plan    Encounter for screening for malignant neoplasm of skin Torso - Posterior (Back)  Annual skin examination.  Encouraged to self examine twice annually.  Seborrheic keratosis Left Abdomen (side) - Lower  No intervention necessary  Seborrheic keratosis, inflamed Right Forearm - Anterior  Destruction of lesion - Right Forearm - Anterior Complexity: simple   Destruction method: cryotherapy   Informed consent: discussed and consent obtained   Timeout:  patient name, date of birth, surgical site, and procedure verified Lesion destroyed using liquid nitrogen: Yes   Cryotherapy cycles:  3 Outcome: patient tolerated procedure well with no complications    Wrinkles Right Malar Cheek  Discussed multiple treatment options including consultation with plastic surgeon.  She would like to try a topical so 0.1% tretinoin  prescribed  tretinoin (RETIN-A) 0.1 % cream - Right Malar Cheek Apply topically at bedtime.      I, Lavonna Monarch, MD, have reviewed all documentation for this visit.  The documentation on 02/16/21 for the exam, diagnosis, procedures, and orders are all accurate and complete.

## 2021-02-18 ENCOUNTER — Encounter: Payer: Self-pay | Admitting: Family Medicine

## 2021-02-19 ENCOUNTER — Other Ambulatory Visit: Payer: Self-pay | Admitting: Family Medicine

## 2021-02-19 DIAGNOSIS — E2839 Other primary ovarian failure: Secondary | ICD-10-CM

## 2021-02-19 MED ORDER — EVAMIST 1.53 MG/SPRAY TD SOLN: TRANSDERMAL | 1 refills | Status: DC

## 2021-02-21 ENCOUNTER — Encounter: Payer: Self-pay | Admitting: Dermatology

## 2021-02-22 ENCOUNTER — Telehealth: Payer: Self-pay | Admitting: *Deleted

## 2021-02-22 NOTE — Telephone Encounter (Signed)
Patient called  because pharmacy filled tretinoin 0.01 gel - not 0.1% cream- informed patient that she would have to call pharmacy and discuss that issue with pharmacy.  We sent in the correct prescription for tretinoin 0.1 % cream.

## 2021-03-01 ENCOUNTER — Encounter: Payer: Self-pay | Admitting: Dermatology

## 2021-03-13 DIAGNOSIS — Z23 Encounter for immunization: Secondary | ICD-10-CM | POA: Diagnosis not present

## 2021-03-15 ENCOUNTER — Encounter: Payer: Self-pay | Admitting: Family Medicine

## 2021-06-30 ENCOUNTER — Other Ambulatory Visit: Payer: Self-pay

## 2021-06-30 ENCOUNTER — Ambulatory Visit (INDEPENDENT_AMBULATORY_CARE_PROVIDER_SITE_OTHER): Payer: Medicare Other

## 2021-06-30 VITALS — Ht 64.0 in | Wt 185.0 lb

## 2021-06-30 DIAGNOSIS — Z0000 Encounter for general adult medical examination without abnormal findings: Secondary | ICD-10-CM | POA: Diagnosis not present

## 2021-06-30 NOTE — Patient Instructions (Signed)
Ms. Wonder , ?Thank you for taking time to come for your Medicare Wellness Visit. I appreciate your ongoing commitment to your health goals. Please review the following plan we discussed and let me know if I can assist you in the future.  ? ?Screening recommendations/referrals: ?Colonoscopy: Done 12/03/2012 Repeat in 10 years ? ?Mammogram: Done 10/13/2020 Repeat annually ? ?Bone Density: Done 12/13/2019 Repeat every 2 years ? ?Recommended yearly ophthalmology/optometry visit for glaucoma screening and checkup ?Recommended yearly dental visit for hygiene and checkup ? ?Vaccinations: ?Influenza vaccine: Done 01/19/2021 Repeat annually ? ?Pneumococcal vaccine: Done 08/04/2016, 02/28/2018 and 12/04/2011 ?Tdap vaccine: Due Repeat in 10 years ? ?Shingles vaccine: Discussed.   ?Covid-19:Done 03/13/2021, 10/06/20, 01/17/20, 05/16/19, 04/18/2019. ? ?Advanced directives: Please bring a copy of your health care power of attorney and living will to the office to be added to your chart at your convenience. ? ? ?Conditions/risks identified: Aim for 30 minutes of exercise or brisk walking, 6-8 glasses of water, and 5 servings of fruits and vegetables each day. ? ? ?Next appointment: Follow up in one year for your annual wellness visit 2024. ? ? ?Preventive Care 71 Years and Older, Female ?Preventive care refers to lifestyle choices and visits with your health care provider that can promote health and wellness. ?What does preventive care include? ?A yearly physical exam. This is also called an annual well check. ?Dental exams once or twice a year. ?Routine eye exams. Ask your health care provider how often you should have your eyes checked. ?Personal lifestyle choices, including: ?Daily care of your teeth and gums. ?Regular physical activity. ?Eating a healthy diet. ?Avoiding tobacco and drug use. ?Limiting alcohol use. ?Practicing safe sex. ?Taking low-dose aspirin every day. ?Taking vitamin and mineral supplements as recommended by your health  care provider. ?What happens during an annual well check? ?The services and screenings done by your health care provider during your annual well check will depend on your age, overall health, lifestyle risk factors, and family history of disease. ?Counseling  ?Your health care provider may ask you questions about your: ?Alcohol use. ?Tobacco use. ?Drug use. ?Emotional well-being. ?Home and relationship well-being. ?Sexual activity. ?Eating habits. ?History of falls. ?Memory and ability to understand (cognition). ?Work and work Statistician. ?Reproductive health. ?Screening  ?You may have the following tests or measurements: ?Height, weight, and BMI. ?Blood pressure. ?Lipid and cholesterol levels. These may be checked every 5 years, or more frequently if you are over 71 years old. ?Skin check. ?Lung cancer screening. You may have this screening every year starting at age 71 if you have a 30-pack-year history of smoking and currently smoke or have quit within the past 15 years. ?Fecal occult blood test (FOBT) of the stool. You may have this test every year starting at age 71. ?Flexible sigmoidoscopy or colonoscopy. You may have a sigmoidoscopy every 5 years or a colonoscopy every 10 years starting at age 71. ?Hepatitis C blood test. ?Hepatitis B blood test. ?Sexually transmitted disease (STD) testing. ?Diabetes screening. This is done by checking your blood sugar (glucose) after you have not eaten for a while (fasting). You may have this done every 1-3 years. ?Bone density scan. This is done to screen for osteoporosis. You may have this done starting at age 71. ?Mammogram. This may be done every 1-2 years. Talk to your health care provider about how often you should have regular mammograms. ?Talk with your health care provider about your test results, treatment options, and if necessary, the need for  more tests. ?Vaccines  ?Your health care provider may recommend certain vaccines, such as: ?Influenza vaccine. This is  recommended every year. ?Tetanus, diphtheria, and acellular pertussis (Tdap, Td) vaccine. You may need a Td booster every 10 years. ?Zoster vaccine. You may need this after age 71. ?Pneumococcal 13-valent conjugate (PCV13) vaccine. One dose is recommended after age 71. ?Pneumococcal polysaccharide (PPSV23) vaccine. One dose is recommended after age 71. ?Talk to your health care provider about which screenings and vaccines you need and how often you need them. ?This information is not intended to replace advice given to you by your health care provider. Make sure you discuss any questions you have with your health care provider. ?Document Released: 04/24/2015 Document Revised: 12/16/2015 Document Reviewed: 01/27/2015 ?Elsevier Interactive Patient Education ? 2017 [place]. ? ?Fall Prevention in the Home ?Falls can cause injuries. They can happen to people of all ages. There are many things you can do to make your home safe and to help prevent falls. ?What can I do on the outside of my home? ?Regularly fix the edges of walkways and driveways and fix any cracks. ?Remove anything that might make you trip as you walk through a door, such as a raised step or threshold. ?Trim any bushes or trees on the path to your home. ?Use bright outdoor lighting. ?Clear any walking paths of anything that might make someone trip, such as rocks or tools. ?Regularly check to see if handrails are loose or broken. Make sure that both sides of any steps have handrails. ?Any raised decks and porches should have guardrails on the edges. ?Have any leaves, snow, or ice cleared regularly. ?Use sand or salt on walking paths during winter. ?Clean up any spills in your garage right away. This includes oil or grease spills. ?What can I do in the bathroom? ?Use night lights. ?Install grab bars by the toilet and in the tub and shower. Do not use towel bars as grab bars. ?Use non-skid mats or decals in the tub or shower. ?If you need to sit down in  the shower, use a plastic, non-slip stool. ?Keep the floor dry. Clean up any water that spills on the floor as soon as it happens. ?Remove soap buildup in the tub or shower regularly. ?Attach bath mats securely with double-sided non-slip rug tape. ?Do not have throw rugs and other things on the floor that can make you trip. ?What can I do in the bedroom? ?Use night lights. ?Make sure that you have a light by your bed that is easy to reach. ?Do not use any sheets or blankets that are too big for your bed. They should not hang down onto the floor. ?Have a firm chair that has side arms. You can use this for support while you get dressed. ?Do not have throw rugs and other things on the floor that can make you trip. ?What can I do in the kitchen? ?Clean up any spills right away. ?Avoid walking on wet floors. ?Keep items that you use a lot in easy-to-reach places. ?If you need to reach something above you, use a strong step stool that has a grab bar. ?Keep electrical cords out of the way. ?Do not use floor polish or wax that makes floors slippery. If you must use wax, use non-skid floor wax. ?Do not have throw rugs and other things on the floor that can make you trip. ?What can I do with my stairs? ?Do not leave any items on the stairs. ?Make  sure that there are handrails on both sides of the stairs and use them. Fix handrails that are broken or loose. Make sure that handrails are as long as the stairways. ?Check any carpeting to make sure that it is firmly attached to the stairs. Fix any carpet that is loose or worn. ?Avoid having throw rugs at the top or bottom of the stairs. If you do have throw rugs, attach them to the floor with carpet tape. ?Make sure that you have a light switch at the top of the stairs and the bottom of the stairs. If you do not have them, ask someone to add them for you. ?What else can I do to help prevent falls? ?Wear shoes that: ?Do not have high heels. ?Have rubber bottoms. ?Are comfortable  and fit you well. ?Are closed at the toe. Do not wear sandals. ?If you use a stepladder: ?Make sure that it is fully opened. Do not climb a closed stepladder. ?Make sure that both sides of the stepladder
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
## 2021-07-03 ENCOUNTER — Encounter: Payer: Self-pay | Admitting: Family Medicine

## 2021-09-02 ENCOUNTER — Encounter: Payer: Self-pay | Admitting: Family Medicine

## 2021-09-10 ENCOUNTER — Other Ambulatory Visit: Payer: Self-pay | Admitting: Family Medicine

## 2021-09-10 DIAGNOSIS — Z1231 Encounter for screening mammogram for malignant neoplasm of breast: Secondary | ICD-10-CM

## 2021-09-22 ENCOUNTER — Other Ambulatory Visit: Payer: Self-pay

## 2021-09-22 ENCOUNTER — Encounter: Payer: Self-pay | Admitting: Family Medicine

## 2021-09-22 DIAGNOSIS — I10 Essential (primary) hypertension: Secondary | ICD-10-CM

## 2021-09-22 MED ORDER — VALSARTAN-HYDROCHLOROTHIAZIDE 160-12.5 MG PO TABS: 1.0000 | ORAL_TABLET | Freq: Every day | ORAL | 0 refills | Status: DC

## 2021-10-14 ENCOUNTER — Ambulatory Visit: Payer: Medicare Other

## 2021-10-27 ENCOUNTER — Encounter: Payer: Self-pay | Admitting: Dermatology

## 2021-10-27 ENCOUNTER — Ambulatory Visit (INDEPENDENT_AMBULATORY_CARE_PROVIDER_SITE_OTHER): Payer: Self-pay | Admitting: Dermatology

## 2021-10-27 DIAGNOSIS — L988 Other specified disorders of the skin and subcutaneous tissue: Secondary | ICD-10-CM

## 2021-10-27 NOTE — Patient Instructions (Signed)
Due to recent changes in healthcare laws, you may see results of your pathology and/or laboratory studies on MyChart before the doctors have had a chance to review them. We understand that in some cases there may be results that are confusing or concerning to you. Please understand that not all results are received at the same time and often the doctors may need to interpret multiple results in order to provide you with the best plan of care or course of treatment. Therefore, we ask that you please give us 2 business days to thoroughly review all your results before contacting the office for clarification. Should we see a critical lab result, you will be contacted sooner.   If You Need Anything After Your Visit  If you have any questions or concerns for your doctor, please call our main line at [phone] and press option 4 to reach your doctor's medical assistant. If no one answers, please leave a voicemail as directed and we will return your call as soon as possible. Messages left after 4 pm will be answered the following business day.   You may also send us a message via MyChart. We typically respond to MyChart messages within 1-2 business days.  For prescription refills, please ask your pharmacy to contact our office. Our fax number is [phone].  If you have an urgent issue when the clinic is closed that cannot wait until the next business day, you can page your doctor at the number below.    Please note that while we do our best to be available for urgent issues outside of office hours, we are not available 24/7.   If you have an urgent issue and are unable to reach us, you may choose to seek medical care at your doctor's office, retail clinic, urgent care center, or emergency room.  If you have a medical emergency, please immediately call 911 or go to the emergency department.  Pager Numbers  - Dr. Kowalski: [phone]  - Dr. Moye: [phone]  - Dr. Stewart:  [phone]  In the event of inclement weather, please call our main line at [phone] for an update on the status of any delays or closures.  Dermatology Medication Tips: Please keep the boxes that topical medications come in in order to help keep track of the instructions about where and how to use these. Pharmacies typically print the medication instructions only on the boxes and not directly on the medication tubes.   If your medication is too expensive, please contact our office at [phone] option 4 or send us a message through MyChart.   We are unable to tell what your co-pay for medications will be in advance as this is different depending on your insurance coverage. However, we may be able to find a substitute medication at lower cost or fill out paperwork to get insurance to cover a needed medication.   If a prior authorization is required to get your medication covered by your insurance company, please allow us 1-2 business days to complete this process.  Drug prices often vary depending on where the prescription is filled and some pharmacies may offer cheaper prices.  The website www.goodrx.com contains coupons for medications through different pharmacies. The prices here do not account for what the cost may be with help from insurance (it may be cheaper with your insurance), but the website can give you the price if you did not use any insurance.  - You can print the associated coupon and take it with   your prescription to the pharmacy.  - You may also stop by our office during regular business hours and pick up a GoodRx coupon card.  - If you need your prescription sent electronically to a different pharmacy, notify our office through [facility] MyChart or by phone at [phone] option 4.     Si Usted Necesita Algo Despus de Su Visita  Tambin puede enviarnos un mensaje a travs de MyChart. Por lo general respondemos a los mensajes de MyChart en el transcurso de 1 a 2  das hbiles.  Para renovar recetas, por favor pida a su farmacia que se ponga en contacto con nuestra oficina. Nuestro nmero de fax es el [phone].  Si tiene un asunto urgente cuando la clnica est cerrada y que no puede esperar hasta el siguiente da hbil, puede llamar/localizar a su doctor(a) al nmero que aparece a continuacin.   Por favor, tenga en cuenta que aunque hacemos todo lo posible para estar disponibles para asuntos urgentes fuera del horario de oficina, no estamos disponibles las 24 horas del da, los 7 das de la semana.   Si tiene un problema urgente y no puede comunicarse con nosotros, puede optar por buscar atencin mdica  en el consultorio de su doctor(a), en una clnica privada, en un centro de atencin urgente o en una sala de emergencias.  Si tiene una emergencia mdica, por favor llame inmediatamente al 911 o vaya a la sala de emergencias.  Nmeros de bper  - Dr. Kowalski: [phone]  - Dra. Moye: [phone]  - Dra. Stewart: [phone]  En caso de inclemencias del tiempo, por favor llame a nuestra lnea principal al [phone] para una actualizacin sobre el estado de cualquier retraso o cierre.  Consejos para la medicacin en dermatologa: Por favor, guarde las cajas en las que vienen los medicamentos de uso tpico para ayudarle a seguir las instrucciones sobre dnde y cmo usarlos. Las farmacias generalmente imprimen las instrucciones del medicamento slo en las cajas y no directamente en los tubos del medicamento.   Si su medicamento es muy caro, por favor, pngase en contacto con nuestra oficina llamando al [phone] y presione la opcin 4 o envenos un mensaje a travs de MyChart.   No podemos decirle cul ser su copago por los medicamentos por adelantado ya que esto es diferente dependiendo de la cobertura de su seguro. Sin embargo, es posible que podamos encontrar un medicamento sustituto a menor costo o llenar un formulario para que el  seguro cubra el medicamento que se considera necesario.   Si se requiere una autorizacin previa para que su compaa de seguros cubra su medicamento, por favor permtanos de 1 a 2 das hbiles para completar este proceso.  Los precios de los medicamentos varan con frecuencia dependiendo del lugar de dnde se surte la receta y alguna farmacias pueden ofrecer precios ms baratos.  El sitio web www.goodrx.com tiene cupones para medicamentos de diferentes farmacias. Los precios aqu no tienen en cuenta lo que podra costar con la ayuda del seguro (puede ser ms barato con su seguro), pero el sitio web puede darle el precio si no utiliz ningn seguro.  - Puede imprimir el cupn correspondiente y llevarlo con su receta a la farmacia.  - Tambin puede pasar por nuestra oficina durante el horario de atencin regular y recoger una tarjeta de cupones de GoodRx.  - Si necesita que su receta se enve electrnicamente a una farmacia diferente, informe a nuestra oficina a travs de MyChart de [place]   o por telfono llamando al [phone] y presione la opcin 4.  

## 2021-10-27 NOTE — Progress Notes (Signed)
   New Patient Visit  Subjective  Danielle Schmidt is a 71 y.o. female who presents for the following: Facial Elastosis (Patient would like to discuss Botox treatment. She was last injected about 6-7 years ago. ).  The following portions of the chart were reviewed this encounter and updated as appropriate:   Tobacco  Allergies  Meds  Problems  Med Hx  Surg Hx  Fam Hx     Review of Systems:  No other skin or systemic complaints except as noted in HPI or Assessment and Plan.  Objective  Well appearing patient in no apparent distress; mood and affect are within normal limits.  A focused examination was performed including the face. Relevant physical exam findings are noted in the Assessment and Plan.  Face Rhytides and volume loss.                   Assessment & Plan  Elastosis of skin Face Discussed 27.5 units to the frown complex. Patient states that she had good results with 10 units in the past. Advised patient that she will not likely have adequate results with 10 units and I recommend a minimum dose of at lease 20 units to the frown complex to see results.  Botox 27.5 units injected into the frown complex.   Botox Injection - Face Location: See attached image  Informed consent: Discussed risks (infection, pain, bleeding, bruising, swelling, allergic reaction, paralysis of nearby muscles, eyelid droop, double vision, neck weakness, difficulty breathing, headache, undesirable cosmetic result, and need for additional treatment) and benefits of the procedure, as well as the alternatives.  Informed consent was obtained.  Preparation: The area was cleansed with alcohol.  Procedure Details:  Botox was injected into the dermis with a 30-gauge needle. Pressure applied to any bleeding. Ice packs offered for swelling.  Lot Number:  [id number] Expiration:  12/2023  Total Units Injected:  27.5  Plan: Patient was instructed to remain upright for 4 hours. Patient was  instructed to avoid massaging the face and avoid vigorous exercise for the rest of the day. Tylenol may be used for headache.  Allow 2 weeks before returning to clinic for additional dosing as needed. Patient will call for any problems.  Return for Botox follow up in 3-4 weeks.  Luther Redo, CMA, am acting as scribe for Sarina Ser, MD . Documentation: I have reviewed the above documentation for accuracy and completeness, and I agree with the above.  Sarina Ser, MD

## 2021-11-06 ENCOUNTER — Encounter: Payer: Self-pay | Admitting: Dermatology

## 2021-11-12 ENCOUNTER — Other Ambulatory Visit: Payer: Self-pay | Admitting: Family Medicine

## 2021-11-12 DIAGNOSIS — I10 Essential (primary) hypertension: Secondary | ICD-10-CM

## 2021-11-12 DIAGNOSIS — R739 Hyperglycemia, unspecified: Secondary | ICD-10-CM

## 2021-11-17 ENCOUNTER — Ambulatory Visit: Payer: Medicare Other

## 2021-11-21 ENCOUNTER — Encounter: Payer: Self-pay | Admitting: Family Medicine

## 2021-11-22 ENCOUNTER — Other Ambulatory Visit (INDEPENDENT_AMBULATORY_CARE_PROVIDER_SITE_OTHER): Payer: Medicare Other

## 2021-11-22 DIAGNOSIS — R739 Hyperglycemia, unspecified: Secondary | ICD-10-CM

## 2021-11-22 DIAGNOSIS — I10 Essential (primary) hypertension: Secondary | ICD-10-CM

## 2021-11-22 LAB — CBC WITH DIFFERENTIAL/PLATELET
Basophils Absolute: 0.1 10*3/uL (ref 0.0–0.1)
Basophils Relative: 0.8 % (ref 0.0–3.0)
Eosinophils Absolute: 0.1 10*3/uL (ref 0.0–0.7)
Eosinophils Relative: 1.9 % (ref 0.0–5.0)
HCT: 40 % (ref 36.0–46.0)
Hemoglobin: 13 g/dL (ref 12.0–15.0)
Lymphocytes Relative: 40.2 % (ref 12.0–46.0)
Lymphs Abs: 2.5 10*3/uL (ref 0.7–4.0)
MCHC: 32.5 g/dL (ref 30.0–36.0)
MCV: 85.5 fl (ref 78.0–100.0)
Monocytes Absolute: 0.4 10*3/uL (ref 0.1–1.0)
Monocytes Relative: 6.4 % (ref 3.0–12.0)
Neutro Abs: 3.1 10*3/uL (ref 1.4–7.7)
Neutrophils Relative %: 50.7 % (ref 43.0–77.0)
Platelets: 207 10*3/uL (ref 150.0–400.0)
RBC: 4.69 Mil/uL (ref 3.87–5.11)
RDW: 13.8 % (ref 11.5–15.5)
WBC: 6.2 10*3/uL (ref 4.0–10.5)

## 2021-11-22 LAB — COMPREHENSIVE METABOLIC PANEL
ALT: 20 U/L (ref 0–35)
AST: 18 U/L (ref 0–37)
Albumin: 4.1 g/dL (ref 3.5–5.2)
Alkaline Phosphatase: 70 U/L (ref 39–117)
BUN: 16 mg/dL (ref 6–23)
CO2: 30 mEq/L (ref 19–32)
Calcium: 8.9 mg/dL (ref 8.4–10.5)
Chloride: 103 mEq/L (ref 96–112)
Creatinine, Ser: 0.88 mg/dL (ref 0.40–1.20)
GFR: 66.28 mL/min (ref >60.00)
Glucose, Bld: 89 mg/dL (ref 70–99)
Potassium: 4 mEq/L (ref 3.5–5.1)
Sodium: 140 mEq/L (ref 135–145)
Total Bilirubin: 0.5 mg/dL (ref 0.2–1.2)
Total Protein: 6.2 g/dL (ref 6.0–8.3)

## 2021-11-22 LAB — LIPID PANEL
Cholesterol: 208 mg/dL — ABNORMAL HIGH (ref 0–200)
HDL: 61.4 mg/dL (ref >39.00)
LDL Cholesterol: 110 mg/dL — ABNORMAL HIGH (ref 0–99)
NonHDL: 147.07
Total CHOL/HDL Ratio: 3
Triglycerides: 185 mg/dL — ABNORMAL HIGH (ref 0.0–149.0)
VLDL: 37 mg/dL (ref 0.0–40.0)

## 2021-11-22 LAB — HEMOGLOBIN A1C: Hgb A1c MFr Bld: 5.7 % (ref 4.6–6.5)

## 2021-11-22 LAB — TSH: TSH: 2.52 u[IU]/mL (ref 0.35–5.50)

## 2021-11-23 ENCOUNTER — Ambulatory Visit
Admission: RE | Admit: 2021-11-23 | Discharge: 2021-11-23 | Disposition: A | Discharge status: Home / Self Care | Payer: Medicare Other | Source: Ambulatory Visit | Attending: Family Medicine | Admitting: Family Medicine

## 2021-11-23 DIAGNOSIS — Z1231 Encounter for screening mammogram for malignant neoplasm of breast: Secondary | ICD-10-CM | POA: Diagnosis not present

## 2021-11-25 ENCOUNTER — Ambulatory Visit: Payer: Medicare Other

## 2021-11-29 ENCOUNTER — Ambulatory Visit (INDEPENDENT_AMBULATORY_CARE_PROVIDER_SITE_OTHER): Payer: Medicare Other | Admitting: Family Medicine

## 2021-11-29 ENCOUNTER — Encounter: Payer: Self-pay | Admitting: Family Medicine

## 2021-11-29 VITALS — BP 122/70 | HR 84 | Temp 98.0°F | Ht 64.0 in | Wt 184.0 lb

## 2021-11-29 DIAGNOSIS — Z0000 Encounter for general adult medical examination without abnormal findings: Secondary | ICD-10-CM

## 2021-11-29 DIAGNOSIS — M7060 Trochanteric bursitis, unspecified hip: Secondary | ICD-10-CM

## 2021-11-29 DIAGNOSIS — F419 Anxiety disorder, unspecified: Secondary | ICD-10-CM | POA: Diagnosis not present

## 2021-11-29 DIAGNOSIS — E782 Mixed hyperlipidemia: Secondary | ICD-10-CM

## 2021-11-29 DIAGNOSIS — I10 Essential (primary) hypertension: Secondary | ICD-10-CM | POA: Diagnosis not present

## 2021-11-29 DIAGNOSIS — Z7189 Other specified counseling: Secondary | ICD-10-CM

## 2021-11-29 MED ORDER — ROSUVASTATIN CALCIUM 5 MG PO TABS: ORAL_TABLET | ORAL | Status: DC

## 2021-11-29 MED ORDER — VALSARTAN-HYDROCHLOROTHIAZIDE 160-12.5 MG PO TABS: 0.5000 | ORAL_TABLET | Freq: Every day | ORAL | 0 refills | Status: DC

## 2021-11-29 MED ORDER — ROSUVASTATIN CALCIUM 5 MG PO TABS: 5.0000 mg | ORAL_TABLET | ORAL | 3 refills | Status: DC

## 2021-11-29 NOTE — Patient Instructions (Addendum)
Likely trochanteric bursitis.  Ice for 5 minutes at a time.   Update [place] if not better.   Stop crestor for 2 weeks and let me know how you are doing.  Cut valsartan HCTZ in half.  See how you feel and let me know about your BP.   I'll check on counseling.  Take care.  Glad to see you.
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
## 2021-12-01 DIAGNOSIS — F419 Anxiety disorder, unspecified: Secondary | ICD-10-CM | POA: Insufficient documentation

## 2021-12-01 DIAGNOSIS — M7060 Trochanteric bursitis, unspecified hip: Secondary | ICD-10-CM | POA: Insufficient documentation

## 2021-12-01 NOTE — Assessment & Plan Note (Signed)
Advance directive- d/w pt. Husband designated if patient were incapacitated.  

## 2021-12-01 NOTE — Assessment & Plan Note (Signed)
Flu yearly Covid prev Shingles up to date Tetanus d/w pt.   PNA up to date.   Colonoscopy 2014 Mammogram 2023 DXA 2021 Advance directive d/w pt.  Husband designated if patient were incapacitated.

## 2021-12-01 NOTE — Assessment & Plan Note (Signed)
Occasionally lightheaded upon standing.  She can cut valsartan hydrochlorothiazide in half and see how she feels.  She can let me know about that when she updates me about her 2 weeks off statin.

## 2021-12-01 NOTE — Assessment & Plan Note (Signed)
Hold statin if she can update me about her aches.  See above.  See after visit summary.

## 2021-12-01 NOTE — Assessment & Plan Note (Signed)
Discussed icing 5 minutes at a time.  Anatomy discussed with patient.  She has aches from the knees distally and that appears to be a separate issue, possibly statin related.

## 2021-12-01 NOTE — Assessment & Plan Note (Signed)
Okay for outpatient follow-up and reasonable for counseling.  Referral placed.

## 2021-12-11 IMAGING — MG MM DIGITAL DIAGNOSTIC UNILAT*L* W/ TOMO W/ CAD
6 series · 6 of 18 positions shown · non-contrast
Comparison: Previous exam(s).

CLINICAL DATA: Patient was called back from screening mammogram for
possible distortion in the left breast. Patient has history of a
benign excisional left breast biopsy.

EXAM:
DIGITAL DIAGNOSTIC UNILATERAL LEFT MAMMOGRAM WITH TOMO AND CAD

[L ML synth-2D]
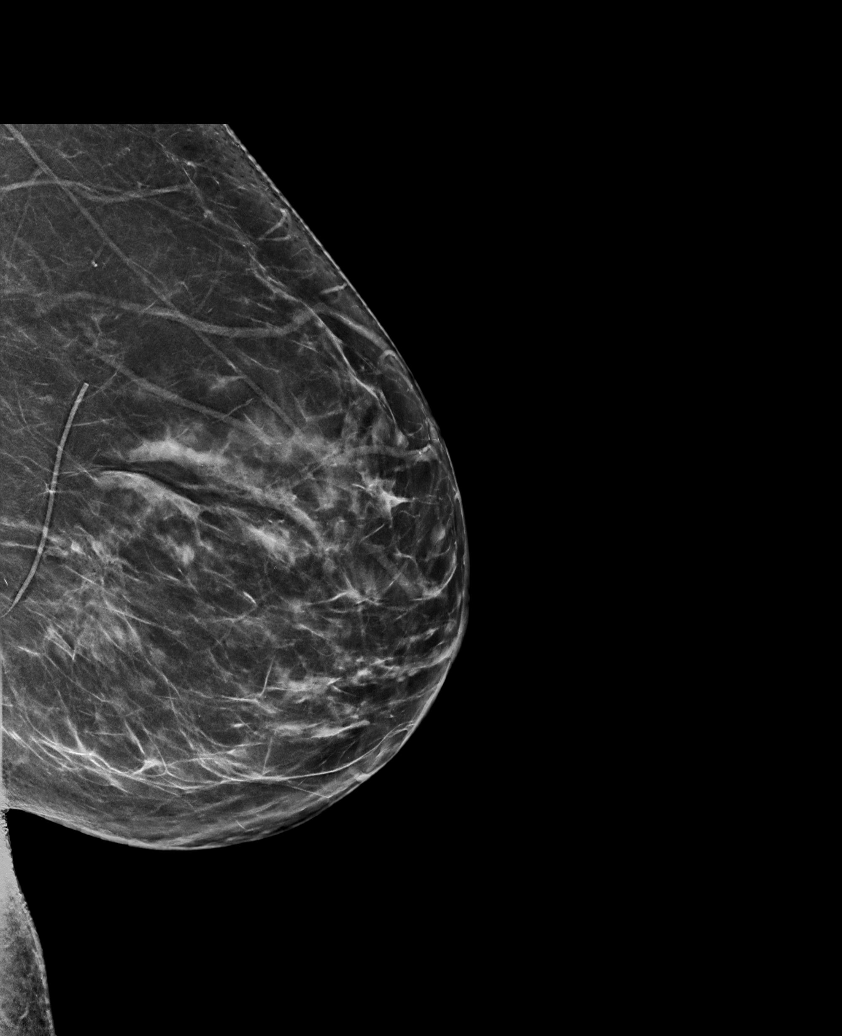

[L MLO synth-2D]
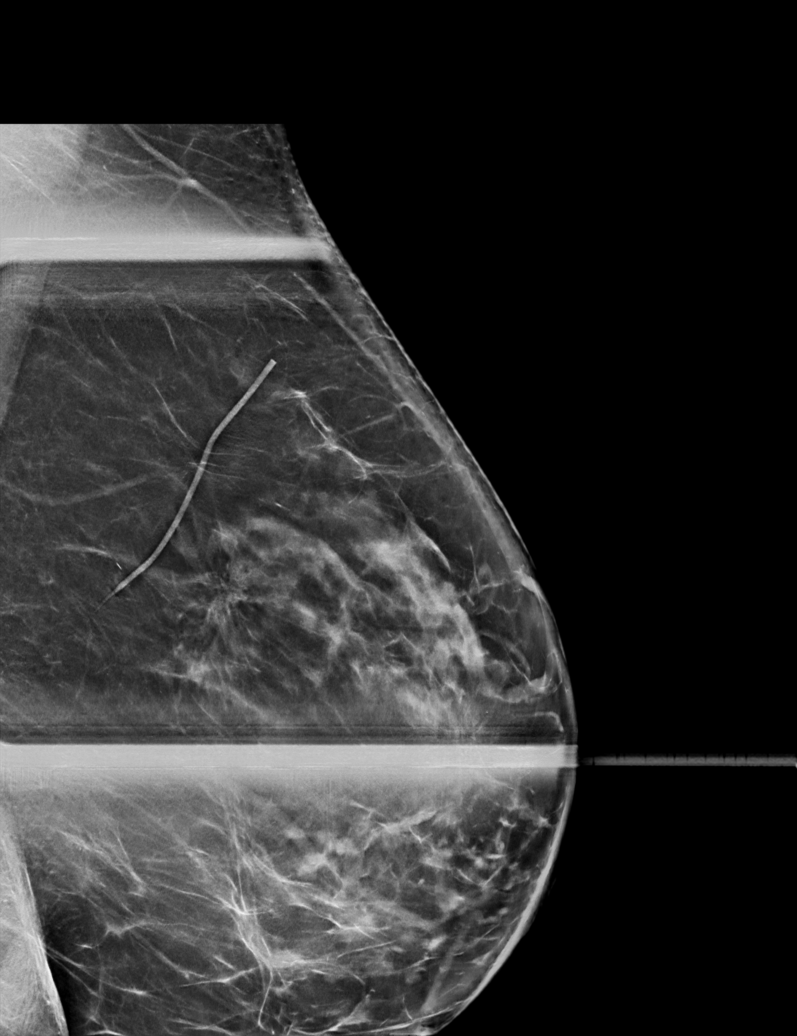

[L CC synth-2D]
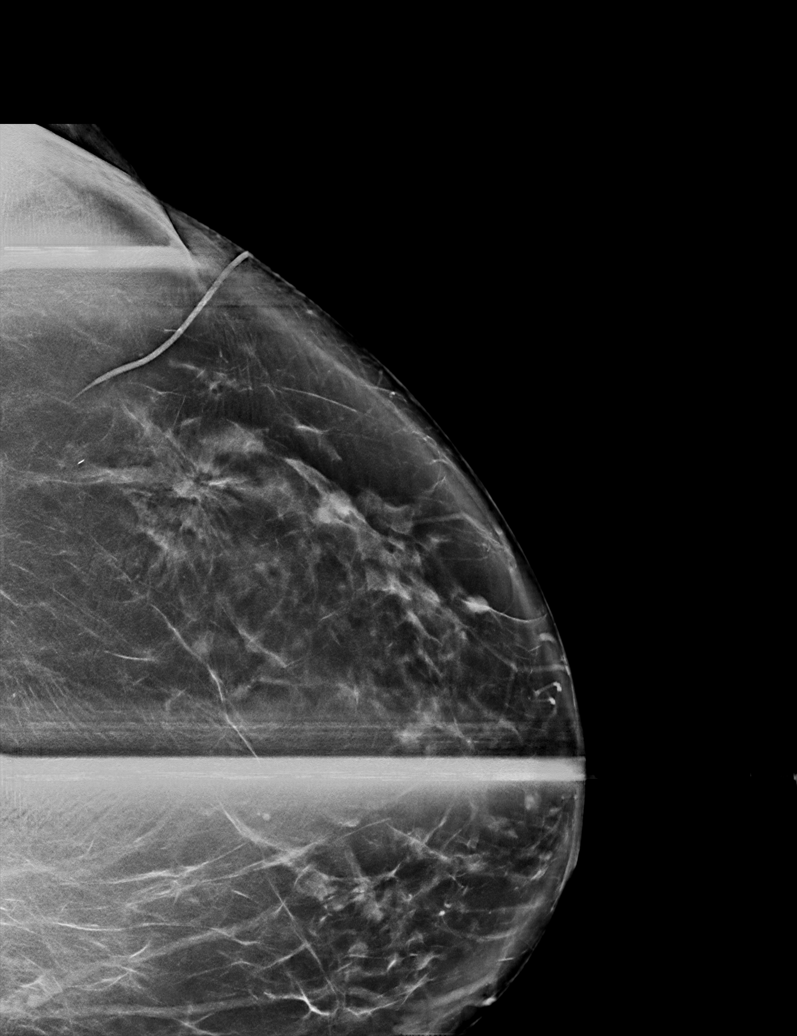

[L ML tomo · tomo slice 39/77.0]
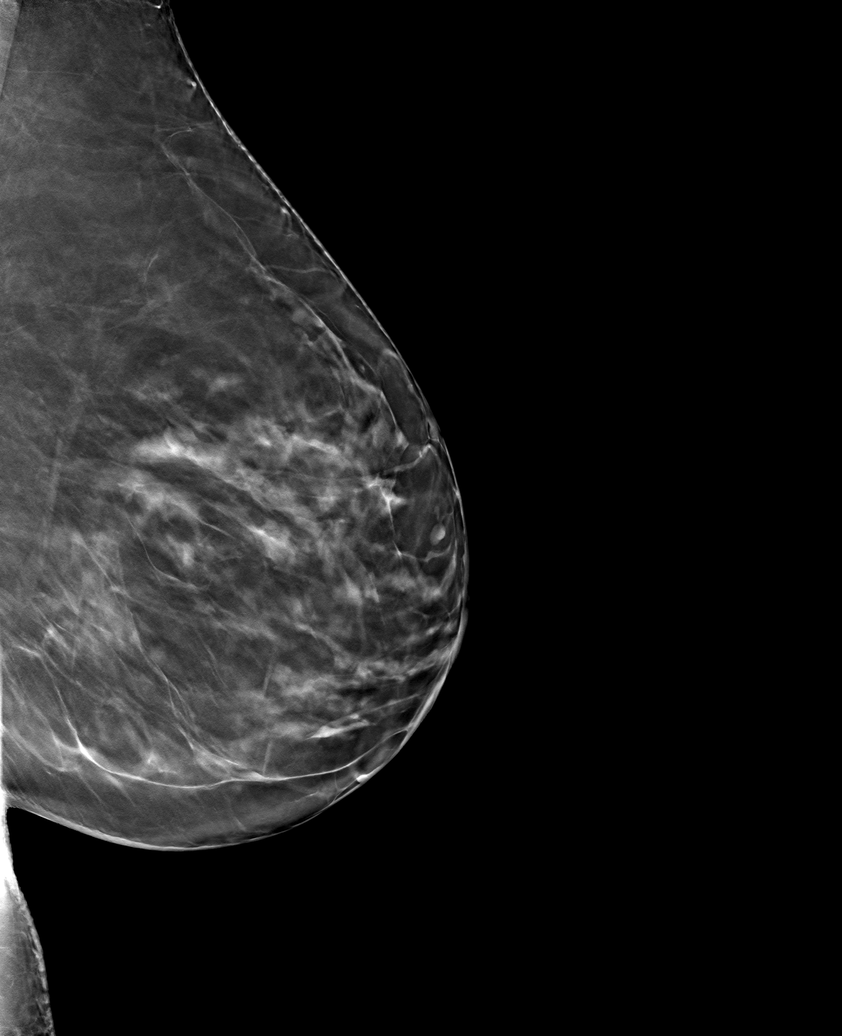

[L CC tomo · tomo slice 47/92.0]
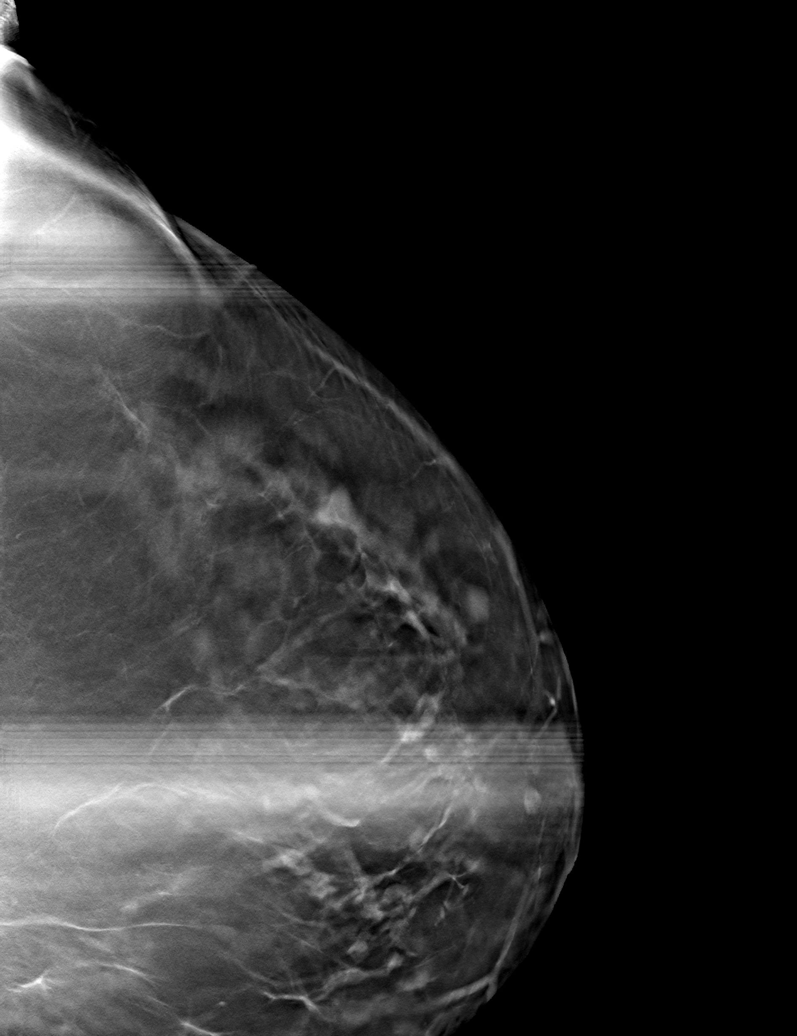

[L MLO tomo · tomo slice 43/86.0]
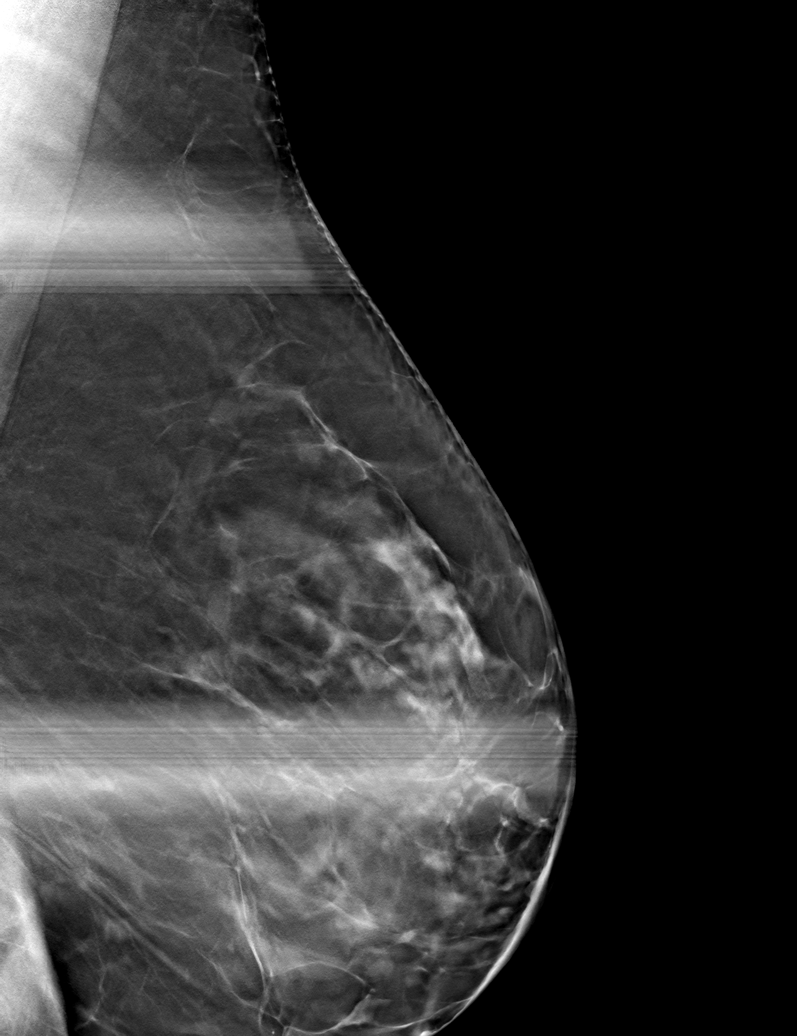

[6 of 18 positions shown; findings below may reference images not displayed]

ACR Breast Density Category c: The breast tissue is heterogeneously
dense, which may obscure small masses.
FINDINGS: Scar marker was placed on the left breast. The distortion in the
upper-outer quadrant of the left breast corresponds with the
surgical excision site. It is unchanged from prior mammograms. No
suspicious mass or malignant type microcalcifications identified.

Mammographic images were processed with CAD.
IMPRESSION: No evidence of malignancy in the left breast.

RECOMMENDATION:
Bilateral screening mammogram in 1 year is recommended.

I have discussed the findings and recommendations with the patient.
If applicable, a reminder letter will be sent to the patient
regarding the next appointment.

BI-RADS CATEGORY  2: Benign.

## 2021-12-14 ENCOUNTER — Encounter: Payer: Self-pay | Admitting: Family Medicine

## 2021-12-15 ENCOUNTER — Telehealth: Payer: Self-pay | Admitting: Family Medicine

## 2021-12-15 DIAGNOSIS — I10 Essential (primary) hypertension: Secondary | ICD-10-CM

## 2021-12-15 MED ORDER — ROSUVASTATIN CALCIUM 5 MG PO TABS: 5.0000 mg | ORAL_TABLET | ORAL | Status: DC

## 2021-12-15 NOTE — Telephone Encounter (Signed)
Call pt.  See other note re: spouse.  Incoming note pasted below.   About her statin dose-if she does not feel significantly different off the medication then I think it makes sense to restart it as she was previously taking it.  If she continues to have leg pain related to her back then we then need to consider options about that here at an office visit or have her see orthopedics.  Please let me know which way she wants to go.  About her blood pressure-those readings are reasonable and I would continue as is with the lower dose.  If she continues to have heart racing then she needs an office visit so we can talk about options there.  Thanks.  ======================================== You asked Mikki Santee and I to report back after two weeks how we felt off the statin drug, and I was to monitor my blood pressure after cutting my medication in half.   Statin report Mikki Santee and I both feel less pain after being off the statin. However, I believe my lower leg pain is related more to my lower back and not the statin drug since they hurt several nights.  I was taking my pill every-other day prior to going off, and Mikki Santee was taking daily. We're a little leary of going off all the way. What if we both did Monday, Wednesday and Friday?   Blood pressure report for me: I'm not certain how accurate my blood pressure cuff is, however, my blood pressure ranged from 120/70 to as high as 135/ 76 one day. It was as low as 117/66 one day. I will say I felt like s--- for two days after I cut the dose. Feeling fine now, no dizziness in the morning. Have had my heart race a couple nights, which hadn't happened in a long time. I was on amlodipine for a time, but Tor Netters took me off after I'd dropped some weight. Anxious to know your thoughts.   Thanks,   [employer]

## 2021-12-16 ENCOUNTER — Ambulatory Visit: Payer: Medicare Other | Admitting: Clinical

## 2021-12-17 ENCOUNTER — Other Ambulatory Visit: Payer: Self-pay | Admitting: Family Medicine

## 2021-12-17 ENCOUNTER — Other Ambulatory Visit: Payer: Self-pay | Admitting: Cardiovascular Disease

## 2021-12-17 DIAGNOSIS — I10 Essential (primary) hypertension: Secondary | ICD-10-CM

## 2021-12-17 MED ORDER — VALSARTAN-HYDROCHLOROTHIAZIDE 160-12.5 MG PO TABS: 0.5000 | ORAL_TABLET | Freq: Every day | ORAL | 3 refills | Status: DC

## 2021-12-17 MED ORDER — ROSUVASTATIN CALCIUM 5 MG PO TABS: 5.0000 mg | ORAL_TABLET | ORAL | 3 refills | Status: DC

## 2021-12-17 NOTE — Telephone Encounter (Addendum)
Diovan/HCTZ doesn't come in a 80/6.'25mg'$  dose so I would keep taking it as is, ie with a 1/2 tab a day of the 160/12.'5mg'$ .  I sent that rx for that dose.    It looks like the for diovan/HCTZ was sent earlier today for the 160/12.5 with 1 tab a day.  If so, that shouldn't have been done.  Please check with pharmacy about that.  If it was sent for the whole tab dose, then let me know.    It looks like crestor wasn't sent (ie "no print" on the order).  I sent that.  Thanks.

## 2021-12-17 NOTE — Addendum Note (Signed)
Addended by: Tonia Ghent on: 12/17/2021 05:12 PM   Modules accepted: Orders

## 2021-12-17 NOTE — Telephone Encounter (Signed)
Please schedule office visit for refills. Thank you! 

## 2021-12-17 NOTE — Telephone Encounter (Signed)
Left a message on voicemail to call the office back. ?

## 2021-12-17 NOTE — Telephone Encounter (Signed)
Spoke to pt.   She will need a refill of the rosuvastatin. Sent in rx to [facility].  She said she has been taking the 1/2 tab of valsartan-hctz 160-12.5 as directed. She is due for a refill. Asking if the dose needs to be changed to a lower dose or continue cutting the tab in half.   Send to [facility]

## 2021-12-17 NOTE — Telephone Encounter (Signed)
Patient called to return a call back.

## 2021-12-17 NOTE — Telephone Encounter (Signed)
Called pharmacy to verify was on hold for over 22 minutes. Last script in chart is for 1/2 tab daily. I have called patient and verified that she is aware that she should be taking 1/2 tab daily and explained the situation. She is aware and knows that she needs to take 1/2 tab daily. She will pick up both over the weekend.

## 2021-12-17 NOTE — Addendum Note (Signed)
Addended by: Pilar Grammes on: 12/17/2021 03:01 PM   Modules accepted: Orders

## 2021-12-19 NOTE — Telephone Encounter (Signed)
Noted. Thanks.

## 2022-01-07 DIAGNOSIS — Z23 Encounter for immunization: Secondary | ICD-10-CM | POA: Diagnosis not present

## 2022-02-02 ENCOUNTER — Ambulatory Visit: Payer: Medicare Other | Admitting: Dermatology

## 2022-02-15 DIAGNOSIS — H2513 Age-related nuclear cataract, bilateral: Secondary | ICD-10-CM | POA: Diagnosis not present

## 2022-03-04 ENCOUNTER — Encounter: Payer: Self-pay | Admitting: Family Medicine

## 2022-03-04 ENCOUNTER — Ambulatory Visit
Admission: RE | Admit: 2022-03-04 | Discharge: 2022-03-04 | Disposition: A | Discharge status: Home / Self Care | Payer: Medicare Other | Source: Ambulatory Visit | Attending: Urgent Care | Admitting: Urgent Care

## 2022-03-04 VITALS — BP 138/85 | HR 98 | Temp 98.1°F | Resp 17

## 2022-03-04 DIAGNOSIS — J019 Acute sinusitis, unspecified: Secondary | ICD-10-CM | POA: Diagnosis not present

## 2022-03-04 DIAGNOSIS — J209 Acute bronchitis, unspecified: Secondary | ICD-10-CM | POA: Diagnosis not present

## 2022-03-04 MED ORDER — PREDNISONE 20 MG PO TABS: 40.0000 mg | ORAL_TABLET | Freq: Every day | ORAL | 0 refills | Status: AC

## 2022-03-04 MED ORDER — AMOXICILLIN-POT CLAVULANATE 875-125 MG PO TABS: 1.0000 | ORAL_TABLET | Freq: Two times a day (BID) | ORAL | 0 refills | Status: DC

## 2022-03-04 MED ORDER — DEXAMETHASONE SODIUM PHOSPHATE 10 MG/ML IJ SOLN
10.0000 mg | Freq: Once | INTRAMUSCULAR | Status: AC
  Administered 2022-03-04: 10 mg via INTRAMUSCULAR

## 2022-03-04 MED ORDER — HYDROCOD POLI-CHLORPHE POLI ER 10-8 MG/5ML PO SUER: 5.0000 mL | Freq: Two times a day (BID) | ORAL | 0 refills | Status: AC | PRN

## 2022-03-04 NOTE — ED Triage Notes (Signed)
Pt. Presents to UC w/ c/o a cough, congestion and sinus pressure that started 5 days ago.
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
## 2022-03-04 NOTE — Discharge Instructions (Signed)
Follow up here or with your primary care provider if your symptoms are worsening or not improving with treatment.     

## 2022-03-17 ENCOUNTER — Other Ambulatory Visit: Payer: Self-pay | Admitting: Cardiovascular Disease

## 2022-03-17 NOTE — Telephone Encounter (Signed)
Please contact pt for future appointment. Pt overdue for 12 month f/u. Pt needing refills.

## 2022-03-17 NOTE — Telephone Encounter (Signed)
Please contact patient for overdue follow up appt. Last seen 12-29-20.  Refill request pending appt.  Thank you.

## 2022-03-18 ENCOUNTER — Telehealth: Payer: Self-pay | Admitting: Cardiovascular Disease

## 2022-03-18 NOTE — Telephone Encounter (Signed)
LVM to schedule appt, please schedule 

## 2022-06-15 DIAGNOSIS — D2272 Melanocytic nevi of left lower limb, including hip: Secondary | ICD-10-CM | POA: Diagnosis not present

## 2022-06-15 DIAGNOSIS — D0361 Melanoma in situ of right upper limb, including shoulder: Secondary | ICD-10-CM | POA: Diagnosis not present

## 2022-06-15 DIAGNOSIS — D485 Neoplasm of uncertain behavior of skin: Secondary | ICD-10-CM | POA: Diagnosis not present

## 2022-06-15 DIAGNOSIS — D2262 Melanocytic nevi of left upper limb, including shoulder: Secondary | ICD-10-CM | POA: Diagnosis not present

## 2022-06-15 DIAGNOSIS — D225 Melanocytic nevi of trunk: Secondary | ICD-10-CM | POA: Diagnosis not present

## 2022-06-15 DIAGNOSIS — L821 Other seborrheic keratosis: Secondary | ICD-10-CM | POA: Diagnosis not present

## 2022-06-15 DIAGNOSIS — D2271 Melanocytic nevi of right lower limb, including hip: Secondary | ICD-10-CM | POA: Diagnosis not present

## 2022-06-15 DIAGNOSIS — X32XXXA Exposure to sunlight, initial encounter: Secondary | ICD-10-CM | POA: Diagnosis not present

## 2022-06-15 DIAGNOSIS — Z08 Encounter for follow-up examination after completed treatment for malignant neoplasm: Secondary | ICD-10-CM | POA: Diagnosis not present

## 2022-06-15 DIAGNOSIS — D2261 Melanocytic nevi of right upper limb, including shoulder: Secondary | ICD-10-CM | POA: Diagnosis not present

## 2022-06-15 DIAGNOSIS — Z85828 Personal history of other malignant neoplasm of skin: Secondary | ICD-10-CM | POA: Diagnosis not present

## 2022-06-15 DIAGNOSIS — L570 Actinic keratosis: Secondary | ICD-10-CM | POA: Diagnosis not present

## 2022-06-28 ENCOUNTER — Encounter: Payer: Self-pay | Admitting: Family Medicine

## 2022-06-29 ENCOUNTER — Other Ambulatory Visit: Payer: Self-pay | Admitting: Family Medicine

## 2022-06-29 DIAGNOSIS — Z1211 Encounter for screening for malignant neoplasm of colon: Secondary | ICD-10-CM

## 2022-07-01 ENCOUNTER — Other Ambulatory Visit: Payer: Self-pay

## 2022-07-01 ENCOUNTER — Telehealth: Payer: Self-pay

## 2022-07-01 DIAGNOSIS — Z1211 Encounter for screening for malignant neoplasm of colon: Secondary | ICD-10-CM

## 2022-07-01 MED ORDER — NA SULFATE-K SULFATE-MG SULF 17.5-3.13-1.6 GM/177ML PO SOLN: 1.0000 | Freq: Once | ORAL | 0 refills | Status: AC

## 2022-07-01 NOTE — Telephone Encounter (Signed)
Pt called to schedule colonoscopy

## 2022-07-01 NOTE — Addendum Note (Signed)
Addended by: Vanetta Mulders on: 07/01/2022 01:51 PM   Modules accepted: Orders

## 2022-07-01 NOTE — Telephone Encounter (Signed)
Gastroenterology Pre-Procedure Review  Request Date: 07/26/22 Requesting Physician: Dr. Marius Ditch  PATIENT REVIEW QUESTIONS: The patient responded to the following health history questions as indicated:    1. Are you having any GI issues? no 2. Do you have a personal history of Polyps? no 3. Do you have a family history of Colon Cancer or Polyps? yes (father colon polyps) 4. Diabetes Mellitus? no 5. Joint replacements in the past 12 months?no 6. Major health problems in the past 3 months?no 7. Any artificial heart valves, MVP, or defibrillator?no    MEDICATIONS & ALLERGIES:    Patient reports the following regarding taking any anticoagulation/antiplatelet therapy:   Plavix, Coumadin, Eliquis, Xarelto, Lovenox, Pradaxa, Brilinta, or Effient? no Aspirin? no  Patient confirms/reports the following medications:  Current Outpatient Medications  Medication Sig Dispense Refill   acyclovir (ZOVIRAX) 400 MG tablet Take 1 tablet (400 mg total) by mouth 2 (two) times daily as needed. 30 tablet 1   albuterol (VENTOLIN HFA) 108 (90 Base) MCG/ACT inhaler Inhale 2 puffs into the lungs every 4 (four) hours as needed for wheezing or shortness of breath (cough, shortness of breath or wheezing.). 6.7 g 0   amoxicillin-clavulanate (AUGMENTIN) 875-125 MG tablet Take 1 tablet by mouth every 12 (twelve) hours. 14 tablet 0   ASHWAGANDHA PO Take by mouth.     cholecalciferol (VITAMIN D) 1000 units tablet Take 1 tablet (1,000 Units total) by mouth daily. 30 tablet 3   Coenzyme Q10 (COQ-10 PO) Take by mouth.     Collagen Hydrolysate, Bovine, POWD Take 1 Scoop by mouth daily.     Magnesium 500 MG CAPS Take by mouth.     rosuvastatin (CRESTOR) 5 MG tablet Take 1 tablet (5 mg total) by mouth every other day. 45 tablet 3   tretinoin (RETIN-A) 0.01 % gel Apply topically at bedtime.     triamcinolone cream (KENALOG) 0.1 % as needed.      valsartan-hydrochlorothiazide (DIOVAN-HCT) 160-12.5 MG tablet Take 0.5 tablets by  mouth daily. 45 tablet 3   No current facility-administered medications for this visit.    Patient confirms/reports the following allergies:  No Known Allergies  No orders of the defined types were placed in this encounter.   AUTHORIZATION INFORMATION Primary Insurance: 1D#: Group #:  Secondary Insurance: 1D#: Group #:  SCHEDULE INFORMATION: Date: 07/26/22 Time: Location:

## 2022-07-04 ENCOUNTER — Telehealth: Payer: Self-pay | Admitting: Gastroenterology

## 2022-07-04 ENCOUNTER — Telehealth: Payer: Self-pay

## 2022-07-04 NOTE — Telephone Encounter (Signed)
Patient calling needing to reschedule the date of her colonoscopy. Requesting call back.

## 2022-07-04 NOTE — Telephone Encounter (Signed)
Patient contacted office to reschedule colonoscopy with Dr. Marius Ditch.  Colonoscopy has been rescheduled to 08/03/22.  Trish in Endo notified of date change.  Thanks, Sharyn Lull CMA

## 2022-07-06 ENCOUNTER — Telehealth: Payer: Self-pay | Admitting: Family Medicine

## 2022-07-06 ENCOUNTER — Encounter: Payer: Self-pay | Admitting: Family Medicine

## 2022-07-06 NOTE — Telephone Encounter (Signed)
Contacted [name] to schedule their annual wellness visit. Patient declined to schedule AWV at this time. Do not call list  [place] Direct Dial: [phone]

## 2022-07-06 NOTE — Telephone Encounter (Signed)
See below.  Please talk to me about this.  Thanks.

## 2022-07-06 NOTE — Telephone Encounter (Signed)
Patient called in and stated that she was scheduled for a medicare call this morning, but no one called her. She was wanting to know if she will receive a call or if she will need to be rescheduled. Please advise. Thank you

## 2022-07-06 NOTE — Telephone Encounter (Signed)
Called patient to re-schedule AWV on 07/06/2022. Patient was upset because no one call her. I apologized, and informed her that we made a mistake in scheduling and was scheduled on the wrong schedule ( [facility]).  Patient was upset that she wasted her day, and declined any further AWV appointments.

## 2022-07-20 DIAGNOSIS — D0361 Melanoma in situ of right upper limb, including shoulder: Secondary | ICD-10-CM | POA: Diagnosis not present

## 2022-08-02 ENCOUNTER — Encounter: Payer: Self-pay | Admitting: Gastroenterology

## 2022-08-03 ENCOUNTER — Ambulatory Visit: Payer: Medicare Other | Admitting: Anesthesiology

## 2022-08-03 ENCOUNTER — Other Ambulatory Visit: Payer: Self-pay

## 2022-08-03 ENCOUNTER — Ambulatory Visit
Admission: RE | Admit: 2022-08-03 | Discharge: 2022-08-03 | Disposition: A | Discharge status: Home / Self Care | Payer: Medicare Other | Attending: Gastroenterology | Admitting: Gastroenterology

## 2022-08-03 ENCOUNTER — Encounter
Admission: RE | Disposition: A | Discharge status: Home / Self Care | Payer: Self-pay | Source: Home / Self Care | Attending: Gastroenterology

## 2022-08-03 ENCOUNTER — Encounter: Payer: Self-pay | Admitting: Gastroenterology

## 2022-08-03 DIAGNOSIS — Z1211 Encounter for screening for malignant neoplasm of colon: Secondary | ICD-10-CM

## 2022-08-03 DIAGNOSIS — I10 Essential (primary) hypertension: Secondary | ICD-10-CM | POA: Insufficient documentation

## 2022-08-03 DIAGNOSIS — K635 Polyp of colon: Secondary | ICD-10-CM | POA: Diagnosis not present

## 2022-08-03 DIAGNOSIS — D126 Benign neoplasm of colon, unspecified: Secondary | ICD-10-CM | POA: Diagnosis not present

## 2022-08-03 HISTORY — PX: COLONOSCOPY WITH PROPOFOL: SHX5780

## 2022-08-03 SURGERY — COLONOSCOPY WITH PROPOFOL
Anesthesia: General

## 2022-08-03 MED ORDER — SODIUM CHLORIDE 0.9 % IV SOLN: INTRAVENOUS | Status: DC

## 2022-08-03 MED ORDER — PROPOFOL 10 MG/ML IV BOLUS
INTRAVENOUS | Status: AC
  Filled 2022-08-03: qty 40

## 2022-08-03 MED ORDER — PROPOFOL 10 MG/ML IV BOLUS
INTRAVENOUS | Status: DC | PRN
  Administered 2022-08-03: 30 mg via INTRAVENOUS
  Administered 2022-08-03 (×2): 50 mg via INTRAVENOUS
  Administered 2022-08-03: 40 mg via INTRAVENOUS
  Administered 2022-08-03: 70 mg via INTRAVENOUS

## 2022-08-03 MED ORDER — LIDOCAINE HCL (CARDIAC) PF 100 MG/5ML IV SOSY
PREFILLED_SYRINGE | INTRAVENOUS | Status: DC | PRN
  Administered 2022-08-03: 50 mg via INTRAVENOUS

## 2022-08-03 MED ORDER — PROPOFOL 500 MG/50ML IV EMUL
INTRAVENOUS | Status: DC | PRN
  Administered 2022-08-03: 75 ug/kg/min via INTRAVENOUS

## 2022-08-03 MED ORDER — PROPOFOL 10 MG/ML IV BOLUS
INTRAVENOUS | Status: AC
  Filled 2022-08-03: qty 20
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
## 2022-08-03 NOTE — Op Note (Signed)
[facility] Patient Name: Danielle Schmidt Procedure Date: 08/03/2022 8:18 AM MRN: [id number] Account #: [id number] Date of Birth: 12-May-1950 Admit Type: Outpatient Age: 72 Room: [facility] ENDO ROOM 4 Gender: Female Note Status: Finalized Instrument Name: Nelda Marseille [id number] Procedure:             Colonoscopy Indications:           Screening for colorectal malignant neoplasm, Last                         colonoscopy 10 years ago Providers:             Toney Reil MD, MD Medicines:             General Anesthesia Complications:         No immediate complications. Estimated blood loss: None. Procedure:             Pre-Anesthesia Assessment:                        - Prior to the procedure, a History and Physical was                         performed, and patient medications and allergies were                         reviewed. The patient is competent. The risks and                         benefits of the procedure and the sedation options and                         risks were discussed with the patient. All questions                         were answered and informed consent was obtained.                         Patient identification and proposed procedure were                         verified by the physician, the nurse, the                         anesthesiologist, the anesthetist and the technician                         in the pre-procedure area in the procedure room in the                         endoscopy suite. Mental Status Examination: alert and                         oriented. Airway Examination: normal oropharyngeal                         airway and neck mobility. Respiratory Examination:                         clear to auscultation. CV  Examination: normal.                         Prophylactic Antibiotics: The patient does not require                         prophylactic antibiotics. Prior Anticoagulants: The                          patient has taken no anticoagulant or antiplatelet                         agents. ASA Grade Assessment: II - A patient with mild                         systemic disease. After reviewing the risks and                         benefits, the patient was deemed in satisfactory                         condition to undergo the procedure. The anesthesia                         plan was to use general anesthesia. Immediately prior                         to administration of medications, the patient was                         re-assessed for adequacy to receive sedatives. The                         heart rate, respiratory rate, oxygen saturations,                         blood pressure, adequacy of pulmonary ventilation, and                         response to care were monitored throughout the                         procedure. The physical status of the patient was                         re-assessed after the procedure.                        After obtaining informed consent, the colonoscope was                         passed under direct vision. Throughout the procedure,                         the patient's blood pressure, pulse, and oxygen                         saturations were monitored continuously. The  Colonoscope was introduced through the anus and                         advanced to the the cecum, identified by appendiceal                         orifice and ileocecal valve. The colonoscopy was                         performed without difficulty. The patient tolerated                         the procedure well. The quality of the bowel                         preparation was evaluated using the BBPS [facility] Bowel                         Preparation Scale) with scores of: Right Colon = 3,                         Transverse Colon = 3 and Left Colon = 3 (entire mucosa                         seen well with no residual staining, small fragments                          of stool or opaque liquid). The total BBPS score                         equals 9. The ileocecal valve, appendiceal orifice,                         and rectum were photographed. Findings:      The perianal and digital rectal examinations were normal. Pertinent       negatives include normal sphincter tone and no palpable rectal lesions.      A diminutive polyp was found in the ileocecal valve. The polyp was       sessile. The polyp was removed with a jumbo cold forceps. Resection and       retrieval were complete. Estimated blood loss: none.      The exam was otherwise without abnormality.      The retroflexed view of the distal rectum and anal verge was normal and       showed no anal or rectal abnormalities. Impression:            - One diminutive polyp at the ileocecal valve, removed                         with a jumbo cold forceps. Resected and retrieved.                        - The examination was otherwise normal.                        - The distal rectum and anal verge are normal on  retroflexion view. Recommendation:        - Discharge patient to home (with escort).                        - Resume previous diet today.                        - Continue present medications.                        - Await pathology results.                        - Repeat colonoscopy in 7-10 years for surveillance                         based on pathology results. Procedure Code(s):     --- Professional ---                        [id number], Colonoscopy, flexible; with biopsy, single or                         multiple Diagnosis Code(s):     --- Professional ---                        Z12.11, Encounter for screening for malignant neoplasm                         of colon                        D12.0, Benign neoplasm of cecum CPT copyright 2022 American Medical Association. All rights reserved. The codes documented in this report are preliminary and upon coder review  may  be revised to meet current compliance requirements. Dr. Libby Maw Toney Reil MD, MD 08/03/2022 8:54:59 AM This report has been signed electronically. Number of Addenda: 0 Note Initiated On: 08/03/2022 8:18 AM Scope Withdrawal Time: 0 hours 11 minutes 0 seconds  Total Procedure Duration: 0 hours 22 minutes 3 seconds  Estimated Blood Loss:  Estimated blood loss: none.      [facility]

## 2022-08-03 NOTE — Transfer of Care (Signed)
Immediate Anesthesia Transfer of Care Note  Patient: Danielle Schmidt  Procedure(s) Performed: COLONOSCOPY WITH PROPOFOL  Patient Location: PACU and Endoscopy Unit  Anesthesia Type:General  Level of Consciousness: patient cooperative and responds to stimulation  Airway & Oxygen Therapy: Patient Spontanous Breathing  Post-op Assessment: Report given to RN and Post -op Vital signs reviewed and stable  Post vital signs: Reviewed and stable  Last Vitals:  Vitals Value Taken Time  BP 105/45 08/03/22 0855  Temp 36 C 08/03/22 0854  Pulse 89 08/03/22 0856  Resp 14 08/03/22 0856  SpO2 97 % 08/03/22 0856  Vitals shown include unvalidated device data.  Last Pain:  Vitals:   08/03/22 0854  TempSrc: Temporal  PainSc: Asleep         Complications: No notable events documented.

## 2022-08-03 NOTE — Anesthesia Preprocedure Evaluation (Signed)
Anesthesia Evaluation  Patient identified by MRN, date of birth, ID band Patient awake    Reviewed: Allergy & Precautions, NPO status , Patient's Chart, lab work & pertinent test results  History of Anesthesia Complications Negative for: history of anesthetic complications  Airway Mallampati: II  TM Distance: >3 FB Neck ROM: Full    Dental no notable dental hx. (+) Teeth Intact   Pulmonary asthma , neg sleep apnea, neg COPD, Patient abstained from smoking.Not current smoker Mild asthma   Pulmonary exam normal breath sounds clear to auscultation       Cardiovascular Exercise Tolerance: Good METShypertension, Pt. on medications (-) CAD and (-) Past MI (-) dysrhythmias  Rhythm:Regular Rate:Normal - Systolic murmurs    Neuro/Psych  PSYCHIATRIC DISORDERS Anxiety     negative neurological ROS     GI/Hepatic ,neg GERD  ,,(+)     (-) substance abuse    Endo/Other  neg diabetes    Renal/GU negative Renal ROS     Musculoskeletal   Abdominal   Peds  Hematology   Anesthesia Other Findings Past Medical History: No date: Arthritis No date: Asthma     Comment:  starting in adulthood, used inhaler for some time,               mother was smoker in past 07/26/2016: Atypical mole     Comment:  mild-right glute 03/19/2018: Basal cell carcinoma     Comment:  above right brow mid-mohs. No date: History of chicken pox No date: History of UTI No date: Hyperlipidemia No date: Hypertension 08/21/2017: Squamous cell carcinoma of skin     Comment:  above right brow-mohs  Reproductive/Obstetrics                              Anesthesia Physical Anesthesia Plan  ASA: 2  Anesthesia Plan: General   Post-op Pain Management: Minimal or no pain anticipated   Induction: Intravenous  PONV Risk Score and Plan: 3 and Propofol infusion, TIVA and Ondansetron  Airway Management Planned: Nasal  Cannula  Additional Equipment: None  Intra-op Plan:   Post-operative Plan:   Informed Consent: I have reviewed the patients History and Physical, chart, labs and discussed the procedure including the risks, benefits and alternatives for the proposed anesthesia with the patient or authorized representative who has indicated his/her understanding and acceptance.     Dental advisory given  Plan Discussed with: CRNA and Surgeon  Anesthesia Plan Comments: (Discussed risks of anesthesia with patient, including possibility of difficulty with spontaneous ventilation under anesthesia necessitating airway intervention, PONV, and rare risks such as cardiac or respiratory or neurological events, and allergic reactions. Discussed the role of CRNA in patient's perioperative care. Patient understands.)         Anesthesia Quick Evaluation

## 2022-08-03 NOTE — Anesthesia Postprocedure Evaluation (Signed)
Anesthesia Post Note  Patient: Danielle Schmidt  Procedure(s) Performed: COLONOSCOPY WITH PROPOFOL  Patient location during evaluation: Endoscopy Anesthesia Type: General Level of consciousness: awake and alert Pain management: pain level controlled Vital Signs Assessment: post-procedure vital signs reviewed and stable Respiratory status: spontaneous breathing, nonlabored ventilation, respiratory function stable and patient connected to nasal cannula oxygen Cardiovascular status: blood pressure returned to baseline and stable Postop Assessment: no apparent nausea or vomiting Anesthetic complications: no   No notable events documented.   Last Vitals:  Vitals:   08/03/22 0854 08/03/22 0904  BP: (!) 105/45 122/65  Pulse: 88   Resp: 13   Temp: (!) 36 C   SpO2: 98%     Last Pain:  Vitals:   08/03/22 0904  TempSrc:   PainSc: 0-No pain                 Corinda Gubler

## 2022-08-04 ENCOUNTER — Encounter: Payer: Self-pay | Admitting: Gastroenterology

## 2022-08-04 LAB — SURGICAL PATHOLOGY

## 2022-10-10 ENCOUNTER — Other Ambulatory Visit: Payer: Self-pay | Admitting: Family Medicine

## 2022-10-10 DIAGNOSIS — Z1231 Encounter for screening mammogram for malignant neoplasm of breast: Secondary | ICD-10-CM

## 2022-11-09 ENCOUNTER — Encounter (INDEPENDENT_AMBULATORY_CARE_PROVIDER_SITE_OTHER): Payer: Self-pay

## 2022-11-24 ENCOUNTER — Other Ambulatory Visit (INDEPENDENT_AMBULATORY_CARE_PROVIDER_SITE_OTHER): Payer: Medicare Other

## 2022-11-24 ENCOUNTER — Other Ambulatory Visit: Payer: Self-pay | Admitting: Family Medicine

## 2022-11-24 DIAGNOSIS — R739 Hyperglycemia, unspecified: Secondary | ICD-10-CM | POA: Diagnosis not present

## 2022-11-24 DIAGNOSIS — I10 Essential (primary) hypertension: Secondary | ICD-10-CM

## 2022-11-24 LAB — CBC WITH DIFFERENTIAL/PLATELET
Basophils Absolute: 0 10*3/uL (ref 0.0–0.1)
Basophils Relative: 0.7 % (ref 0.0–3.0)
Eosinophils Absolute: 0.1 10*3/uL (ref 0.0–0.7)
Eosinophils Relative: 2.2 % (ref 0.0–5.0)
HCT: 43.2 % (ref 36.0–46.0)
Hemoglobin: 13.6 g/dL (ref 12.0–15.0)
Lymphocytes Relative: 42.6 % (ref 12.0–46.0)
Lymphs Abs: 2.7 10*3/uL (ref 0.7–4.0)
MCHC: 31.5 g/dL (ref 30.0–36.0)
MCV: 86.6 fl (ref 78.0–100.0)
Monocytes Absolute: 0.4 10*3/uL (ref 0.1–1.0)
Monocytes Relative: 6.1 % (ref 3.0–12.0)
Neutro Abs: 3 10*3/uL (ref 1.4–7.7)
Neutrophils Relative %: 48.4 % (ref 43.0–77.0)
Platelets: 208 10*3/uL (ref 150.0–400.0)
RBC: 4.99 Mil/uL (ref 3.87–5.11)
RDW: 13.1 % (ref 11.5–15.5)
WBC: 6.2 10*3/uL (ref 4.0–10.5)

## 2022-11-24 LAB — COMPREHENSIVE METABOLIC PANEL
ALT: 22 U/L (ref 0–35)
AST: 17 U/L (ref 0–37)
Albumin: 4.3 g/dL (ref 3.5–5.2)
Alkaline Phosphatase: 70 U/L (ref 39–117)
BUN: 23 mg/dL (ref 6–23)
CO2: 32 mEq/L (ref 19–32)
Calcium: 9.6 mg/dL (ref 8.4–10.5)
Chloride: 100 mEq/L (ref 96–112)
Creatinine, Ser: 0.84 mg/dL (ref 0.40–1.20)
GFR: 69.59 mL/min (ref >60.00)
Glucose, Bld: 100 mg/dL — ABNORMAL HIGH (ref 70–99)
Potassium: 4 mEq/L (ref 3.5–5.1)
Sodium: 139 mEq/L (ref 135–145)
Total Bilirubin: 0.5 mg/dL (ref 0.2–1.2)
Total Protein: 6.6 g/dL (ref 6.0–8.3)

## 2022-11-24 LAB — LIPID PANEL
Cholesterol: 233 mg/dL — ABNORMAL HIGH (ref 0–200)
HDL: 63.2 mg/dL (ref >39.00)
LDL Cholesterol: 142 mg/dL — ABNORMAL HIGH (ref 0–99)
NonHDL: 169.82
Total CHOL/HDL Ratio: 4
Triglycerides: 141 mg/dL (ref 0.0–149.0)
VLDL: 28.2 mg/dL (ref 0.0–40.0)

## 2022-11-24 LAB — TSH: TSH: 2.7 u[IU]/mL (ref 0.35–5.50)

## 2022-11-24 LAB — HEMOGLOBIN A1C: Hgb A1c MFr Bld: 5.6 % (ref 4.6–6.5)

## 2022-11-25 ENCOUNTER — Ambulatory Visit: Payer: Medicare Other

## 2022-11-29 ENCOUNTER — Ambulatory Visit
Admission: RE | Admit: 2022-11-29 | Discharge: 2022-11-29 | Disposition: A | Discharge status: Home / Self Care | Payer: Medicare Other | Source: Ambulatory Visit | Attending: Family Medicine | Admitting: Family Medicine

## 2022-11-29 DIAGNOSIS — Z1231 Encounter for screening mammogram for malignant neoplasm of breast: Secondary | ICD-10-CM | POA: Diagnosis not present

## 2022-12-01 ENCOUNTER — Other Ambulatory Visit: Payer: Self-pay | Admitting: Family Medicine

## 2022-12-01 ENCOUNTER — Encounter: Payer: Self-pay | Admitting: Family Medicine

## 2022-12-01 ENCOUNTER — Encounter: Payer: Medicare Other | Admitting: Family Medicine

## 2022-12-01 DIAGNOSIS — I10 Essential (primary) hypertension: Secondary | ICD-10-CM

## 2022-12-02 NOTE — Telephone Encounter (Signed)
Refill request for Rosuvastatin and valsartan-HCTZ  LOV - 11/29/21 Next OV - 12/27/22 Last refill - 12/17/21 #45/3 on both rxs

## 2022-12-13 ENCOUNTER — Encounter: Payer: Self-pay | Admitting: Emergency Medicine

## 2022-12-13 ENCOUNTER — Ambulatory Visit
Admission: EM | Admit: 2022-12-13 | Discharge: 2022-12-13 | Disposition: A | Discharge status: Home / Self Care | Payer: Medicare Other | Attending: Emergency Medicine | Admitting: Emergency Medicine

## 2022-12-13 DIAGNOSIS — J0100 Acute maxillary sinusitis, unspecified: Secondary | ICD-10-CM

## 2022-12-13 DIAGNOSIS — I10 Essential (primary) hypertension: Secondary | ICD-10-CM

## 2022-12-13 DIAGNOSIS — U071 COVID-19: Secondary | ICD-10-CM | POA: Diagnosis not present

## 2022-12-13 MED ORDER — BENZONATATE 100 MG PO CAPS: 100.0000 mg | ORAL_CAPSULE | Freq: Three times a day (TID) | ORAL | 0 refills | Status: DC | PRN

## 2022-12-13 MED ORDER — AMOXICILLIN 875 MG PO TABS: 875.0000 mg | ORAL_TABLET | Freq: Two times a day (BID) | ORAL | 0 refills | Status: AC
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
## 2022-12-13 NOTE — ED Triage Notes (Signed)
Provider triage  

## 2022-12-13 NOTE — Discharge Instructions (Addendum)
Take the amoxicillin and Tessalon Perles as directed.  Follow up with your primary care provider.    Your blood pressure is elevated today at 155/81.  Please have this rechecked by your primary care provider in 2-4 weeks.

## 2022-12-27 ENCOUNTER — Ambulatory Visit: Payer: Medicare Other | Admitting: Family Medicine

## 2022-12-27 VITALS — BP 122/80 | HR 76 | Temp 98.0°F | Ht 64.0 in | Wt 188.0 lb

## 2022-12-27 DIAGNOSIS — Z23 Encounter for immunization: Secondary | ICD-10-CM

## 2022-12-27 DIAGNOSIS — Z905 Acquired absence of kidney: Secondary | ICD-10-CM

## 2022-12-27 DIAGNOSIS — E782 Mixed hyperlipidemia: Secondary | ICD-10-CM

## 2022-12-27 DIAGNOSIS — Z7189 Other specified counseling: Secondary | ICD-10-CM

## 2022-12-27 DIAGNOSIS — I10 Essential (primary) hypertension: Secondary | ICD-10-CM | POA: Diagnosis not present

## 2022-12-27 DIAGNOSIS — B009 Herpesviral infection, unspecified: Secondary | ICD-10-CM

## 2022-12-27 DIAGNOSIS — B001 Herpesviral vesicular dermatitis: Secondary | ICD-10-CM | POA: Diagnosis not present

## 2022-12-27 DIAGNOSIS — Z0000 Encounter for general adult medical examination without abnormal findings: Secondary | ICD-10-CM

## 2022-12-27 DIAGNOSIS — J4520 Mild intermittent asthma, uncomplicated: Secondary | ICD-10-CM

## 2022-12-27 DIAGNOSIS — M25519 Pain in unspecified shoulder: Secondary | ICD-10-CM | POA: Diagnosis not present

## 2022-12-27 DIAGNOSIS — D039 Melanoma in situ, unspecified: Secondary | ICD-10-CM

## 2022-12-27 MED ORDER — ROSUVASTATIN CALCIUM 5 MG PO TABS: 5.0000 mg | ORAL_TABLET | Freq: Every day | ORAL | 3 refills | Status: DC

## 2022-12-27 MED ORDER — ALBUTEROL SULFATE HFA 108 (90 BASE) MCG/ACT IN AERS: 2.0000 | INHALATION_SPRAY | RESPIRATORY_TRACT | 1 refills | Status: AC | PRN

## 2022-12-27 MED ORDER — VALSARTAN-HYDROCHLOROTHIAZIDE 160-12.5 MG PO TABS: 0.5000 | ORAL_TABLET | Freq: Every day | ORAL | 3 refills | Status: DC

## 2022-12-27 MED ORDER — ROSUVASTATIN CALCIUM 5 MG PO TABS: 5.0000 mg | ORAL_TABLET | Freq: Every day | ORAL | Status: DC

## 2022-12-27 MED ORDER — ACYCLOVIR 400 MG PO TABS: 400.0000 mg | ORAL_TABLET | Freq: Two times a day (BID) | ORAL | 1 refills | Status: AC | PRN

## 2022-12-27 NOTE — Progress Notes (Unsigned)
I have personally reviewed the Medicare Annual Wellness questionnaire and have noted 1. The patient's medical and social history 2. Their use of alcohol, tobacco or illicit drugs 3. Their current medications and supplements 4. The patient's functional ability including ADL's, fall risks, home safety risks and hearing or visual             impairment. 5. Diet and physical activities 6. Evidence for depression or mood disorders  The patients weight, height, BMI have been recorded in the chart and visual acuity is per eye clinic.  I have made referrals, counseling and provided education to the patient based review of the above and I have provided the pt with a written personalized care plan for preventive services.  Provider list updated- see scanned forms.  Routine anticipatory guidance given to patient.  See health maintenance. The possibility exists that previously documented standard health maintenance information may have been brought forward from a previous encounter into this note.  If needed, that same information has been updated to reflect the current situation based on today's encounter.    Flu 2024 Shingles prev done PNA prev done Tetanus d/w pt.  COVID vaccine prev done, illness 2024 Colonoscopy 2024 Breast cancer screening 2024 Advance directive- husband designated if patient were incapacitated.   Cognitive function addressed- see scanned forms- and if abnormal then additional documentation follows.   In addition to [facility], follow up visit for the below conditions:  H/o nephrectomy, labs d/w pt.  Cr stable.    R shoulder pain.   Pain with int more than ext rotation. Pain sleeping on R side at night.  No trauma.  No acute injury.  See exam.    D/w pt about prev HRT- off rx currently.  She noted inc facial hair off HRT, d/w pt about checking with dermatology.    Hypertension:    Using medication without problems or lightheadedness: yes Chest pain with  exertion:no Edema:no Short of breath:no  Elevated Cholesterol: Using medications without problems: yes Muscle aches: no Diet compliance: d/w pt.  Exercise: d/w pt.   She can tolerate crestor daily.   D/w pt  about prev cardiac calcium score of zero.    Prn use acyclovir.  Rare use overall.    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  Has dermatology f/u pending.    PMH and SH reviewed  Meds, vitals, and allergies reviewed.   ROS: Per HPI.  Unless specifically indicated otherwise in HPI, the patient denies:  General: fever. Eyes: acute vision changes ENT: sore throat Cardiovascular: chest pain Respiratory: SOB GI: vomiting GU: dysuria Musculoskeletal: acute back pain Derm: acute rash Neuro: acute motor dysfunction Psych: worsening mood Endocrine: polydipsia Heme: bleeding Allergy: hayfever  GEN: nad, alert and oriented HEENT: ncat NECK: supple w/o LA CV: rrr. PULM: ctab, no inc wob ABD: soft, +bs EXT: no edema SKIN: no acute rash  R shoulder- pain wtih int more than ext rotation. No arm drop.  AC not ttp.   Pain improves with scapular manipulation.

## 2022-12-27 NOTE — Patient Instructions (Signed)
You should get a call about seeing PT.   Don't change your meds for now . Take care.  Glad to see you. Ask the skin clinic to send me a copy of your note tomorrow.

## 2022-12-28 ENCOUNTER — Telehealth: Payer: Self-pay | Admitting: Family Medicine

## 2022-12-28 ENCOUNTER — Encounter: Payer: Self-pay | Admitting: Family Medicine

## 2022-12-28 DIAGNOSIS — Z86006 Personal history of melanoma in-situ: Secondary | ICD-10-CM | POA: Diagnosis not present

## 2022-12-28 DIAGNOSIS — L570 Actinic keratosis: Secondary | ICD-10-CM | POA: Diagnosis not present

## 2022-12-28 DIAGNOSIS — Z0000 Encounter for general adult medical examination without abnormal findings: Secondary | ICD-10-CM | POA: Insufficient documentation

## 2022-12-28 DIAGNOSIS — B360 Pityriasis versicolor: Secondary | ICD-10-CM | POA: Diagnosis not present

## 2022-12-28 DIAGNOSIS — X32XXXA Exposure to sunlight, initial encounter: Secondary | ICD-10-CM | POA: Diagnosis not present

## 2022-12-28 DIAGNOSIS — D039 Melanoma in situ, unspecified: Secondary | ICD-10-CM | POA: Insufficient documentation

## 2022-12-28 DIAGNOSIS — Z08 Encounter for follow-up examination after completed treatment for malignant neoplasm: Secondary | ICD-10-CM | POA: Diagnosis not present

## 2022-12-28 DIAGNOSIS — L821 Other seborrheic keratosis: Secondary | ICD-10-CM | POA: Diagnosis not present

## 2022-12-28 DIAGNOSIS — M25519 Pain in unspecified shoulder: Secondary | ICD-10-CM | POA: Insufficient documentation

## 2022-12-28 DIAGNOSIS — D225 Melanocytic nevi of trunk: Secondary | ICD-10-CM | POA: Diagnosis not present

## 2022-12-28 NOTE — Assessment & Plan Note (Signed)
D/w pt about RTC pathology/anatomy and refer to PT.  Rationale d/w pt.  She agrees.

## 2022-12-28 NOTE — Assessment & Plan Note (Signed)
Flu 2024 Shingles prev done PNA prev done Tetanus d/w pt.  COVID vaccine prev done, illness 2024 Colonoscopy 2024 Breast cancer screening 2024 Advance directive- husband designated if patient were incapacitated.   Cognitive function addressed- see scanned forms- and if abnormal then additional documentation follows.

## 2022-12-28 NOTE — Assessment & Plan Note (Signed)
Advance directive- husband designated if patient were incapacitated.

## 2022-12-28 NOTE — Assessment & Plan Note (Signed)
Continue valsartan hydrochlorothiazide.  Labs d/w pt.  Continue work on diet and exercise.

## 2022-12-28 NOTE — Assessment & Plan Note (Signed)
Per derm, s/p resection.

## 2022-12-28 NOTE — Telephone Encounter (Signed)
error 

## 2022-12-28 NOTE — Assessment & Plan Note (Signed)
Prn use acyclovir.  Rare use overall.

## 2022-12-28 NOTE — Assessment & Plan Note (Signed)
Continue crestor.  Labs d/w pt.  Continue work on diet and exercise.

## 2022-12-28 NOTE — Assessment & Plan Note (Signed)
H/o, Cr stable, d/w pt.

## 2022-12-29 ENCOUNTER — Ambulatory Visit: Payer: Medicare Other | Admitting: Physical Therapy

## 2023-01-03 ENCOUNTER — Ambulatory Visit: Payer: Medicare Other | Attending: Family Medicine

## 2023-01-03 DIAGNOSIS — M25511 Pain in right shoulder: Secondary | ICD-10-CM | POA: Insufficient documentation

## 2023-01-03 DIAGNOSIS — M25519 Pain in unspecified shoulder: Secondary | ICD-10-CM | POA: Diagnosis not present
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
## 2023-01-04 NOTE — Progress Notes (Signed)
Physician Signature: Joaquim Nam, MD Date:___09/25/24 ____ Time:__12:28 PM

## 2023-01-05 ENCOUNTER — Encounter: Payer: Self-pay | Admitting: Physical Therapy

## 2023-01-05 ENCOUNTER — Ambulatory Visit: Payer: Medicare Other | Admitting: Physical Therapy

## 2023-01-05 DIAGNOSIS — M25511 Pain in right shoulder: Secondary | ICD-10-CM | POA: Diagnosis not present

## 2023-01-05 DIAGNOSIS — M25519 Pain in unspecified shoulder: Secondary | ICD-10-CM | POA: Diagnosis not present
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
## 2023-01-09 ENCOUNTER — Ambulatory Visit: Payer: Medicare Other

## 2023-01-09 DIAGNOSIS — M25511 Pain in right shoulder: Secondary | ICD-10-CM

## 2023-01-09 DIAGNOSIS — M25519 Pain in unspecified shoulder: Secondary | ICD-10-CM | POA: Diagnosis not present
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
## 2023-01-18 ENCOUNTER — Ambulatory Visit: Payer: Medicare Other | Attending: Family Medicine | Admitting: Physical Therapy

## 2023-01-18 DIAGNOSIS — M25511 Pain in right shoulder: Secondary | ICD-10-CM | POA: Diagnosis not present
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
## 2023-01-25 ENCOUNTER — Ambulatory Visit: Payer: Medicare Other

## 2023-02-02 DIAGNOSIS — M7541 Impingement syndrome of right shoulder: Secondary | ICD-10-CM | POA: Diagnosis not present

## 2023-03-24 ENCOUNTER — Encounter: Payer: Self-pay | Admitting: Family Medicine

## 2023-03-26 ENCOUNTER — Other Ambulatory Visit: Payer: Self-pay | Admitting: Family Medicine

## 2023-03-26 DIAGNOSIS — I10 Essential (primary) hypertension: Secondary | ICD-10-CM

## 2023-03-26 MED ORDER — VALSARTAN-HYDROCHLOROTHIAZIDE 160-12.5 MG PO TABS
0.5000 | ORAL_TABLET | Freq: Every day | ORAL | 3 refills | Status: AC
Start: 2023-03-26 — End: ?

## 2023-03-26 MED ORDER — ROSUVASTATIN CALCIUM 5 MG PO TABS: 5.0000 mg | ORAL_TABLET | Freq: Every day | ORAL | 3 refills | Status: AC

## 2023-06-21 DIAGNOSIS — D2261 Melanocytic nevi of right upper limb, including shoulder: Secondary | ICD-10-CM | POA: Diagnosis not present

## 2023-06-21 DIAGNOSIS — Z86006 Personal history of melanoma in-situ: Secondary | ICD-10-CM | POA: Diagnosis not present

## 2023-06-21 DIAGNOSIS — Z08 Encounter for follow-up examination after completed treatment for malignant neoplasm: Secondary | ICD-10-CM | POA: Diagnosis not present

## 2023-06-21 DIAGNOSIS — Z85820 Personal history of malignant melanoma of skin: Secondary | ICD-10-CM | POA: Diagnosis not present

## 2023-06-21 DIAGNOSIS — D2262 Melanocytic nevi of left upper limb, including shoulder: Secondary | ICD-10-CM | POA: Diagnosis not present

## 2023-06-21 DIAGNOSIS — Z85828 Personal history of other malignant neoplasm of skin: Secondary | ICD-10-CM | POA: Diagnosis not present

## 2023-06-21 DIAGNOSIS — D225 Melanocytic nevi of trunk: Secondary | ICD-10-CM | POA: Diagnosis not present

## 2023-06-21 DIAGNOSIS — L821 Other seborrheic keratosis: Secondary | ICD-10-CM | POA: Diagnosis not present

## 2023-06-21 DIAGNOSIS — D2271 Melanocytic nevi of right lower limb, including hip: Secondary | ICD-10-CM | POA: Diagnosis not present

## 2023-06-21 DIAGNOSIS — D2272 Melanocytic nevi of left lower limb, including hip: Secondary | ICD-10-CM | POA: Diagnosis not present

## 2023-12-04 ENCOUNTER — Telehealth: Payer: Self-pay

## 2023-12-04 NOTE — Telephone Encounter (Signed)
 Copied from CRM #[id number]. Topic: General - Other >> Dec 01, 2023  1:44 PM Berneda FALCON wrote: Reason for CRM: Patient states that she moved to [place]  and wanted to know why her medical history says Patient Declined for her most recent COVID 19 vaccine. She just wanted to be sure there wasn't a medical reason for her not to be vaccinated.  Patient would like callback at [phone]

## 2023-12-04 NOTE — Telephone Encounter (Signed)
 Returned call to patient and advised that its not declined but its postponed. Patient understood
